# Patient Record
Sex: Female | Born: 1955 | Race: Black or African American | Hispanic: No | Marital: Single | Smoking: Never smoker
Health system: Southern US, Community
[De-identification: ages and names within clinical notes are randomized; demographics above are authoritative.]

## PROBLEM LIST (undated history)

## (undated) DIAGNOSIS — E785 Hyperlipidemia, unspecified: Secondary | ICD-10-CM

## (undated) DIAGNOSIS — I10 Essential (primary) hypertension: Secondary | ICD-10-CM

## (undated) DIAGNOSIS — I2510 Atherosclerotic heart disease of native coronary artery without angina pectoris: Secondary | ICD-10-CM

## (undated) HISTORY — DX: Essential (primary) hypertension: I10

## (undated) HISTORY — DX: Hyperlipidemia, unspecified: E78.5

## (undated) HISTORY — DX: Atherosclerotic heart disease of native coronary artery without angina pectoris: I25.10

## (undated) HISTORY — PX: BREAST BIOPSY: SHX20

---

## 1998-06-19 ENCOUNTER — Emergency Department (HOSPITAL_COMMUNITY): Admission: EM | Admit: 1998-06-19 | Discharge: 1998-06-19 | Payer: Self-pay | Admitting: Emergency Medicine

## 1999-12-19 ENCOUNTER — Other Ambulatory Visit: Admission: RE | Admit: 1999-12-19 | Discharge: 1999-12-19 | Payer: Self-pay | Admitting: Obstetrics and Gynecology

## 2000-06-16 ENCOUNTER — Other Ambulatory Visit: Admission: RE | Admit: 2000-06-16 | Discharge: 2000-06-16 | Payer: Self-pay | Admitting: Obstetrics and Gynecology

## 2000-09-01 ENCOUNTER — Other Ambulatory Visit: Admission: RE | Admit: 2000-09-01 | Discharge: 2000-09-01 | Payer: Self-pay | Admitting: Obstetrics and Gynecology

## 2001-03-09 ENCOUNTER — Other Ambulatory Visit: Admission: RE | Admit: 2001-03-09 | Discharge: 2001-03-09 | Payer: Self-pay | Admitting: Obstetrics and Gynecology

## 2001-05-03 ENCOUNTER — Encounter: Payer: Self-pay | Admitting: Family Medicine

## 2001-05-03 ENCOUNTER — Encounter: Admission: RE | Admit: 2001-05-03 | Discharge: 2001-05-03 | Payer: Self-pay | Admitting: Family Medicine

## 2002-02-21 ENCOUNTER — Other Ambulatory Visit: Admission: RE | Admit: 2002-02-21 | Discharge: 2002-02-21 | Payer: Self-pay | Admitting: Obstetrics and Gynecology

## 2003-04-09 ENCOUNTER — Other Ambulatory Visit: Admission: RE | Admit: 2003-04-09 | Discharge: 2003-04-09 | Payer: Self-pay | Admitting: Obstetrics and Gynecology

## 2004-05-30 ENCOUNTER — Other Ambulatory Visit: Admission: RE | Admit: 2004-05-30 | Discharge: 2004-05-30 | Payer: Self-pay | Admitting: Obstetrics and Gynecology

## 2015-04-15 ENCOUNTER — Other Ambulatory Visit: Payer: Self-pay | Admitting: Orthopedic Surgery

## 2015-04-15 DIAGNOSIS — M898X5 Other specified disorders of bone, thigh: Secondary | ICD-10-CM

## 2015-04-18 ENCOUNTER — Other Ambulatory Visit: Payer: Self-pay

## 2015-04-19 ENCOUNTER — Other Ambulatory Visit: Payer: Self-pay

## 2015-04-22 ENCOUNTER — Ambulatory Visit
Admission: RE | Admit: 2015-04-22 | Discharge: 2015-04-22 | Disposition: A | Discharge status: Home / Self Care | Payer: Commercial Managed Care - HMO | Source: Ambulatory Visit | Attending: Orthopedic Surgery | Admitting: Orthopedic Surgery

## 2015-04-22 DIAGNOSIS — M898X5 Other specified disorders of bone, thigh: Secondary | ICD-10-CM

## 2015-04-22 MED ORDER — IOPAMIDOL (ISOVUE-300) INJECTION 61%
100.0000 mL | Freq: Once | INTRAVENOUS | Status: AC | PRN
  Administered 2015-04-22: 100 mL via INTRAVENOUS

## 2016-10-29 DIAGNOSIS — Z86010 Personal history of colonic polyps: Secondary | ICD-10-CM | POA: Diagnosis not present

## 2016-10-29 DIAGNOSIS — K640 First degree hemorrhoids: Secondary | ICD-10-CM | POA: Diagnosis not present

## 2016-10-29 DIAGNOSIS — D126 Benign neoplasm of colon, unspecified: Secondary | ICD-10-CM | POA: Diagnosis not present

## 2017-01-11 DIAGNOSIS — Z1231 Encounter for screening mammogram for malignant neoplasm of breast: Secondary | ICD-10-CM | POA: Diagnosis not present

## 2017-01-11 DIAGNOSIS — E7800 Pure hypercholesterolemia, unspecified: Secondary | ICD-10-CM | POA: Diagnosis not present

## 2017-01-11 DIAGNOSIS — Z01419 Encounter for gynecological examination (general) (routine) without abnormal findings: Secondary | ICD-10-CM | POA: Diagnosis not present

## 2017-03-08 DIAGNOSIS — H00024 Hordeolum internum left upper eyelid: Secondary | ICD-10-CM | POA: Diagnosis not present

## 2017-03-12 DIAGNOSIS — Z1159 Encounter for screening for other viral diseases: Secondary | ICD-10-CM | POA: Diagnosis not present

## 2017-03-12 DIAGNOSIS — D649 Anemia, unspecified: Secondary | ICD-10-CM | POA: Diagnosis not present

## 2017-03-12 DIAGNOSIS — Z0000 Encounter for general adult medical examination without abnormal findings: Secondary | ICD-10-CM | POA: Diagnosis not present

## 2017-03-12 DIAGNOSIS — E7800 Pure hypercholesterolemia, unspecified: Secondary | ICD-10-CM | POA: Diagnosis not present

## 2018-01-24 DIAGNOSIS — Z1231 Encounter for screening mammogram for malignant neoplasm of breast: Secondary | ICD-10-CM | POA: Diagnosis not present

## 2018-01-24 DIAGNOSIS — Z01419 Encounter for gynecological examination (general) (routine) without abnormal findings: Secondary | ICD-10-CM | POA: Diagnosis not present

## 2018-01-24 DIAGNOSIS — Z124 Encounter for screening for malignant neoplasm of cervix: Secondary | ICD-10-CM | POA: Diagnosis not present

## 2018-02-09 DIAGNOSIS — H52223 Regular astigmatism, bilateral: Secondary | ICD-10-CM | POA: Diagnosis not present

## 2018-02-09 DIAGNOSIS — H35033 Hypertensive retinopathy, bilateral: Secondary | ICD-10-CM | POA: Diagnosis not present

## 2018-04-08 DIAGNOSIS — Z23 Encounter for immunization: Secondary | ICD-10-CM | POA: Diagnosis not present

## 2018-04-08 DIAGNOSIS — D649 Anemia, unspecified: Secondary | ICD-10-CM | POA: Diagnosis not present

## 2018-04-08 DIAGNOSIS — Z0000 Encounter for general adult medical examination without abnormal findings: Secondary | ICD-10-CM | POA: Diagnosis not present

## 2018-04-08 DIAGNOSIS — E7800 Pure hypercholesterolemia, unspecified: Secondary | ICD-10-CM | POA: Diagnosis not present

## 2018-06-08 DIAGNOSIS — M1712 Unilateral primary osteoarthritis, left knee: Secondary | ICD-10-CM | POA: Diagnosis not present

## 2018-06-08 DIAGNOSIS — M47816 Spondylosis without myelopathy or radiculopathy, lumbar region: Secondary | ICD-10-CM | POA: Diagnosis not present

## 2018-06-09 DIAGNOSIS — M545 Low back pain: Secondary | ICD-10-CM | POA: Diagnosis not present

## 2018-06-09 DIAGNOSIS — M6281 Muscle weakness (generalized): Secondary | ICD-10-CM | POA: Diagnosis not present

## 2018-06-09 DIAGNOSIS — M4316 Spondylolisthesis, lumbar region: Secondary | ICD-10-CM | POA: Diagnosis not present

## 2018-06-13 DIAGNOSIS — M4316 Spondylolisthesis, lumbar region: Secondary | ICD-10-CM | POA: Diagnosis not present

## 2018-06-13 DIAGNOSIS — M6281 Muscle weakness (generalized): Secondary | ICD-10-CM | POA: Diagnosis not present

## 2018-06-13 DIAGNOSIS — M545 Low back pain: Secondary | ICD-10-CM | POA: Diagnosis not present

## 2018-06-21 DIAGNOSIS — M4316 Spondylolisthesis, lumbar region: Secondary | ICD-10-CM | POA: Diagnosis not present

## 2018-06-21 DIAGNOSIS — M6281 Muscle weakness (generalized): Secondary | ICD-10-CM | POA: Diagnosis not present

## 2018-06-21 DIAGNOSIS — M545 Low back pain: Secondary | ICD-10-CM | POA: Diagnosis not present

## 2018-07-13 DIAGNOSIS — M4316 Spondylolisthesis, lumbar region: Secondary | ICD-10-CM | POA: Diagnosis not present

## 2018-07-13 DIAGNOSIS — M48061 Spinal stenosis, lumbar region without neurogenic claudication: Secondary | ICD-10-CM | POA: Diagnosis not present

## 2018-07-18 DIAGNOSIS — M545 Low back pain: Secondary | ICD-10-CM | POA: Diagnosis not present

## 2019-01-09 ENCOUNTER — Other Ambulatory Visit: Payer: Self-pay | Admitting: Obstetrics & Gynecology

## 2019-01-09 ENCOUNTER — Other Ambulatory Visit (HOSPITAL_COMMUNITY): Payer: Self-pay | Admitting: Obstetrics & Gynecology

## 2019-01-09 DIAGNOSIS — D259 Leiomyoma of uterus, unspecified: Secondary | ICD-10-CM

## 2019-01-13 ENCOUNTER — Other Ambulatory Visit: Payer: Self-pay

## 2019-01-13 ENCOUNTER — Ambulatory Visit (HOSPITAL_COMMUNITY)
Admission: RE | Admit: 2019-01-13 | Discharge: 2019-01-13 | Disposition: A | Discharge status: Home / Self Care | Payer: 59 | Source: Ambulatory Visit | Attending: Obstetrics & Gynecology | Admitting: Obstetrics & Gynecology

## 2019-01-13 DIAGNOSIS — D259 Leiomyoma of uterus, unspecified: Secondary | ICD-10-CM | POA: Diagnosis not present

## 2019-02-28 ENCOUNTER — Other Ambulatory Visit: Payer: Self-pay | Admitting: Obstetrics & Gynecology

## 2019-02-28 DIAGNOSIS — R928 Other abnormal and inconclusive findings on diagnostic imaging of breast: Secondary | ICD-10-CM

## 2019-03-01 ENCOUNTER — Ambulatory Visit
Admission: RE | Admit: 2019-03-01 | Discharge: 2019-03-01 | Disposition: A | Discharge status: Home / Self Care | Payer: BC Managed Care – PPO | Source: Ambulatory Visit | Attending: Obstetrics & Gynecology | Admitting: Obstetrics & Gynecology

## 2019-03-01 ENCOUNTER — Other Ambulatory Visit: Payer: Self-pay

## 2019-03-01 ENCOUNTER — Other Ambulatory Visit: Payer: Self-pay | Admitting: Obstetrics & Gynecology

## 2019-03-01 DIAGNOSIS — R921 Mammographic calcification found on diagnostic imaging of breast: Secondary | ICD-10-CM

## 2019-03-01 DIAGNOSIS — R928 Other abnormal and inconclusive findings on diagnostic imaging of breast: Secondary | ICD-10-CM

## 2019-03-07 ENCOUNTER — Ambulatory Visit
Admission: RE | Admit: 2019-03-07 | Discharge: 2019-03-07 | Disposition: A | Discharge status: Home / Self Care | Payer: BC Managed Care – PPO | Source: Ambulatory Visit | Attending: Obstetrics & Gynecology | Admitting: Obstetrics & Gynecology

## 2019-03-07 ENCOUNTER — Other Ambulatory Visit: Payer: Self-pay

## 2019-03-07 DIAGNOSIS — R921 Mammographic calcification found on diagnostic imaging of breast: Secondary | ICD-10-CM

## 2019-05-28 ENCOUNTER — Ambulatory Visit: Payer: BC Managed Care – PPO | Attending: Internal Medicine

## 2019-05-28 DIAGNOSIS — Z23 Encounter for immunization: Secondary | ICD-10-CM | POA: Insufficient documentation

## 2019-05-28 NOTE — Progress Notes (Signed)
   Covid-19 Vaccination Clinic  Name:  Sherry Webb    MRN: [id number] DOB: September 14, 1955  05/28/2019  [name] was observed post Covid-19 immunization for 15 minutes without incident. She was provided with Vaccine Information Sheet and instruction to access the V-Safe system.   [name] was instructed to call 911 with any severe reactions post vaccine: Marland Kitchen Difficulty breathing  . Swelling of face and throat  . A fast heartbeat  . A bad rash all over body  . Dizziness and weakness   Immunizations Administered    Name Date Dose VIS Date Route   Pfizer COVID-19 Vaccine 05/28/2019  2:34 PM 0.3 mL 03/03/2019 Intramuscular   Manufacturer: [place]   Lot: [id number]   [place]: [id number]

## 2019-06-28 ENCOUNTER — Ambulatory Visit: Payer: BC Managed Care – PPO | Attending: Internal Medicine

## 2019-06-28 ENCOUNTER — Ambulatory Visit: Payer: BC Managed Care – PPO

## 2019-06-28 DIAGNOSIS — Z23 Encounter for immunization: Secondary | ICD-10-CM

## 2019-10-11 ENCOUNTER — Other Ambulatory Visit: Payer: Self-pay

## 2019-10-11 ENCOUNTER — Ambulatory Visit (INDEPENDENT_AMBULATORY_CARE_PROVIDER_SITE_OTHER): Payer: 59 | Admitting: Podiatry

## 2019-10-11 ENCOUNTER — Encounter: Payer: Self-pay | Admitting: Podiatry

## 2019-10-11 DIAGNOSIS — B351 Tinea unguium: Secondary | ICD-10-CM | POA: Diagnosis not present

## 2019-10-11 DIAGNOSIS — L603 Nail dystrophy: Secondary | ICD-10-CM

## 2019-10-11 NOTE — Progress Notes (Signed)
  Subjective:  Patient ID: Sherry Webb, female    DOB: 05-09-1955,  MRN: [id number]  Chief Complaint  Patient presents with  . Nail Problem    Bilateral 2nd toenails. x3-4 months. Pt stated, "I think I have nail fungus, but no OTC treatments have helped. I want to prevent it from spreading to my other nails".    64 y.o. female presents with the above complaint. History confirmed with patient.  She has noticed thickening in the toenails and it is uncomfortable.  Objective:  Physical Exam: warm, good capillary refill, no trophic changes or ulcerative lesions, normal DP and PT pulses and normal sensory exam. Thickening with nail dystrophy and discoloration of the second digit toenail bilaterally  Assessment:   1. Nail fungus   2. Dystrophic nail      Plan:  Patient was evaluated and treated and all questions answered.   Onychomycosis -Educated on etiology of nail fungus. -Nail sample taken for microbiology and histology.  Unclear if this is true onychomycosis versus nail dystrophy -Once I have the results, I will call her.  If there is no fungus we will treat with terbinafine 90-day course; if there is no nail fungus I will prescribe custom compounded cream for nail thinning and nail dystrophy     Return for nail re-check.

## 2019-10-19 ENCOUNTER — Ambulatory Visit: Payer: Self-pay | Admitting: Podiatry

## 2019-11-15 ENCOUNTER — Telehealth: Payer: Self-pay | Admitting: Podiatry

## 2019-11-15 NOTE — Telephone Encounter (Signed)
Path results have not been sent to [place], I spoke with [facility] and they are faxing to [place] today.

## 2019-11-15 NOTE — Telephone Encounter (Signed)
Pt called because she never heard about her test results from back in July. She is on her way to work now, please call her first thing tomorrow a.m.

## 2019-11-15 NOTE — Telephone Encounter (Signed)
Path received, shows dense keratinization c/w microtrauma, no fungal elements noted, no melanin, PCR negative for saprophytes, molds, yeasts, pseudomonas

## 2019-11-16 ENCOUNTER — Other Ambulatory Visit: Payer: Self-pay

## 2019-11-16 ENCOUNTER — Other Ambulatory Visit: Payer: Self-pay | Admitting: Podiatry

## 2019-11-16 MED ORDER — NONFORMULARY OR COMPOUNDED ITEM: Status: DC

## 2019-11-16 NOTE — Telephone Encounter (Signed)
Will be faxed to them today. Thanks!

## 2019-11-16 NOTE — Telephone Encounter (Signed)
I called Ms Goga but couldn't get through, only got a VM directly. If she calls back, her pathology results showed nail changes or dystrophy without any fungal infection.   I'd recommend a nail thinning treatment such as a compounded cream from [facility] or [employer] solution (both would be prescribed by me but would likely have some out of pocket cost for, they usually are not covered by insurance totally).

## 2019-11-16 NOTE — Telephone Encounter (Signed)
Pt called back and was informed her results did not show fungus. Pt would like to have a prescription compound sent in to [facility].

## 2020-01-18 ENCOUNTER — Ambulatory Visit (INDEPENDENT_AMBULATORY_CARE_PROVIDER_SITE_OTHER): Payer: BLUE CROSS/BLUE SHIELD | Admitting: Podiatry

## 2020-01-18 ENCOUNTER — Other Ambulatory Visit: Payer: Self-pay

## 2020-01-18 DIAGNOSIS — M21612 Bunion of left foot: Secondary | ICD-10-CM

## 2020-01-18 DIAGNOSIS — M2012 Hallux valgus (acquired), left foot: Secondary | ICD-10-CM

## 2020-01-18 DIAGNOSIS — M2142 Flat foot [pes planus] (acquired), left foot: Secondary | ICD-10-CM

## 2020-01-18 DIAGNOSIS — B351 Tinea unguium: Secondary | ICD-10-CM

## 2020-01-18 DIAGNOSIS — M2141 Flat foot [pes planus] (acquired), right foot: Secondary | ICD-10-CM | POA: Diagnosis not present

## 2020-01-18 MED ORDER — CICLOPIROX 8 % EX SOLN: Freq: Every day | CUTANEOUS | 0 refills | Status: DC

## 2020-01-18 NOTE — Progress Notes (Signed)
  Subjective:  Patient ID: Sherry Webb, female    DOB: [birth year],  MRN: [id number]  Chief Complaint  Patient presents with  . Follow-up    pt states that fungus is still present and her nails are turning black    64 y.o. female returns with the above complaint. History confirmed with patient.  She did not get the urea gel from [facility] as it was too expensive.  Objective:  Physical Exam: warm, good capillary refill, no trophic changes or ulcerative lesions, normal DP and PT pulses and normal sensory exam. Thickening with nail dystrophy and discoloration of the second digit toenail bilaterally  Assessment:   1. Onychomycosis   2. Pes planus of both feet   3. Hallux valgus with bunions, left      Plan:  Patient was evaluated and treated and all questions answered.   Onychomycosis -Her lab results from [facility] pathology was not able to detect onychomycosis.  She would like to treat this empirically which I think is reasonable.  Prescription for Penlac was sent to her pharmacy, hopeful that the Penlac will help to change improve the texture of the nails.  -She also inquired about arch supports for her feet as she is very active.  I recommended that she try power step orthotics to support her flattened arch.  She will discuss with her insurance if they cover custom molded orthoses and will call for casting if they do  -She also inquired about the bunion on her left foot.  It is becoming painful in shoe gear.  I gave her a silicone offloading pad to try to wear in her shoes.  If this is still bothersome at next visit we will take x-rays and discuss surgical intervention     Return in about 3 months (around 04/19/2020).

## 2020-01-18 NOTE — Patient Instructions (Signed)
More silicone pads can be purchased from:  [email or web address]  

## 2020-03-27 ENCOUNTER — Telehealth: Payer: Self-pay

## 2020-03-27 NOTE — Telephone Encounter (Signed)
Pt called in regarding insoles she was given at her visit on 01/18/2020. She stated that she would like to return them and would like to speak with someone regarding the bill she received.

## 2020-04-18 ENCOUNTER — Ambulatory Visit: Payer: BLUE CROSS/BLUE SHIELD | Admitting: Podiatry

## 2020-09-22 DIAGNOSIS — Z0100 Encounter for examination of eyes and vision without abnormal findings: Secondary | ICD-10-CM | POA: Diagnosis not present

## 2020-11-08 DIAGNOSIS — Z23 Encounter for immunization: Secondary | ICD-10-CM | POA: Diagnosis not present

## 2020-11-08 DIAGNOSIS — I10 Essential (primary) hypertension: Secondary | ICD-10-CM | POA: Diagnosis not present

## 2020-11-08 DIAGNOSIS — E7800 Pure hypercholesterolemia, unspecified: Secondary | ICD-10-CM | POA: Diagnosis not present

## 2020-12-12 ENCOUNTER — Other Ambulatory Visit: Payer: Self-pay | Admitting: Family Medicine

## 2020-12-12 ENCOUNTER — Ambulatory Visit
Admission: RE | Admit: 2020-12-12 | Discharge: 2020-12-12 | Disposition: A | Discharge status: Home / Self Care | Payer: Medicare HMO | Source: Ambulatory Visit | Attending: Family Medicine | Admitting: Family Medicine

## 2020-12-12 DIAGNOSIS — R059 Cough, unspecified: Secondary | ICD-10-CM

## 2021-03-26 DIAGNOSIS — Z6826 Body mass index (BMI) 26.0-26.9, adult: Secondary | ICD-10-CM | POA: Diagnosis not present

## 2021-03-26 DIAGNOSIS — Z1231 Encounter for screening mammogram for malignant neoplasm of breast: Secondary | ICD-10-CM | POA: Diagnosis not present

## 2021-03-26 DIAGNOSIS — Z01419 Encounter for gynecological examination (general) (routine) without abnormal findings: Secondary | ICD-10-CM | POA: Diagnosis not present

## 2021-04-03 ENCOUNTER — Other Ambulatory Visit: Payer: Self-pay | Admitting: Obstetrics & Gynecology

## 2021-04-04 ENCOUNTER — Other Ambulatory Visit: Payer: Self-pay | Admitting: Obstetrics & Gynecology

## 2021-04-04 DIAGNOSIS — Z1231 Encounter for screening mammogram for malignant neoplasm of breast: Secondary | ICD-10-CM

## 2021-05-12 DIAGNOSIS — Z0000 Encounter for general adult medical examination without abnormal findings: Secondary | ICD-10-CM | POA: Diagnosis not present

## 2021-05-12 DIAGNOSIS — Z23 Encounter for immunization: Secondary | ICD-10-CM | POA: Diagnosis not present

## 2021-05-12 DIAGNOSIS — E7800 Pure hypercholesterolemia, unspecified: Secondary | ICD-10-CM | POA: Diagnosis not present

## 2021-05-12 DIAGNOSIS — E2839 Other primary ovarian failure: Secondary | ICD-10-CM | POA: Diagnosis not present

## 2021-05-12 DIAGNOSIS — I10 Essential (primary) hypertension: Secondary | ICD-10-CM | POA: Diagnosis not present

## 2021-05-16 IMAGING — MG MM BREAST BX W/ LOC DEV 1ST LESION IMAGE BX SPEC STEREO GUIDE*R*
8 of 15 series · 8 of 19 positions shown · non-contrast
Comparison: Previous exams.
COMPARISON: Previous exams.

Addendum:
CLINICAL DATA: Patient with indeterminate calcifications in the
RIGHT breast presents today for stereotactic biopsy using 3D
tomosynthesis guidance.

EXAM:
RIGHT BREAST STEREOTACTIC CORE NEEDLE BIOPSY

[R (1 of 8)]
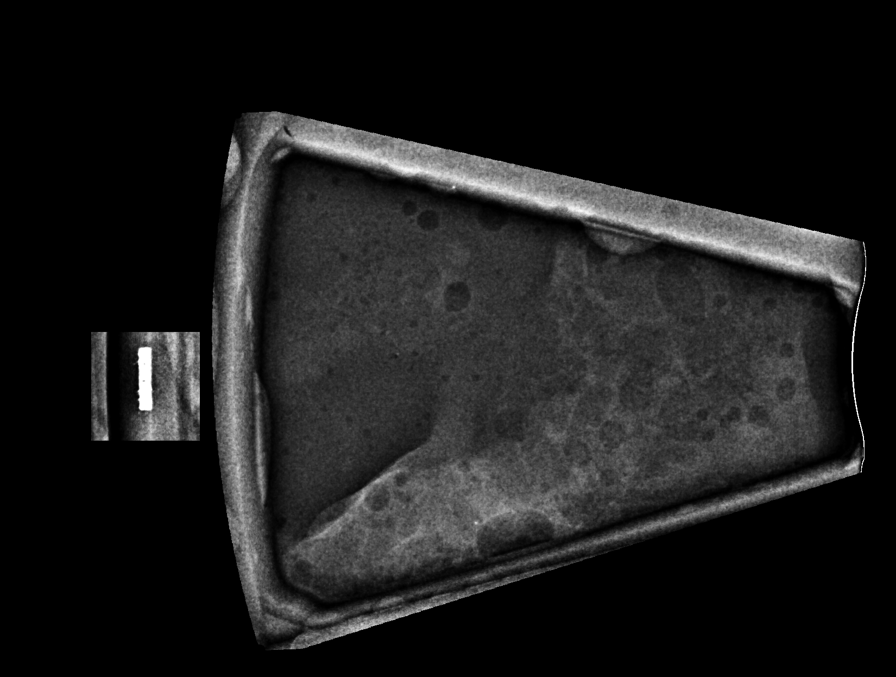

[R (2 of 8)]
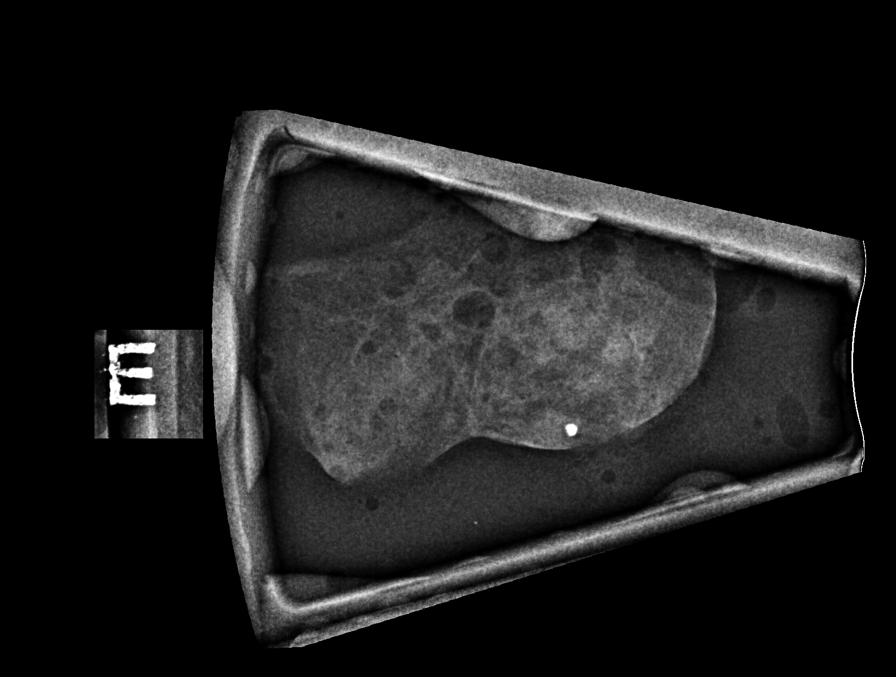

[R (3 of 8)]
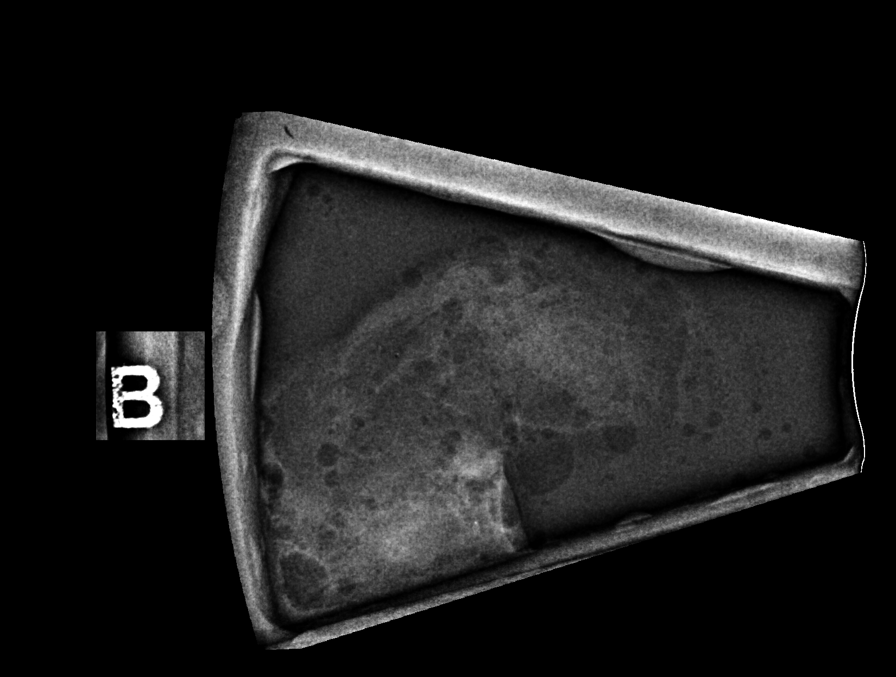

[R (4 of 8)]
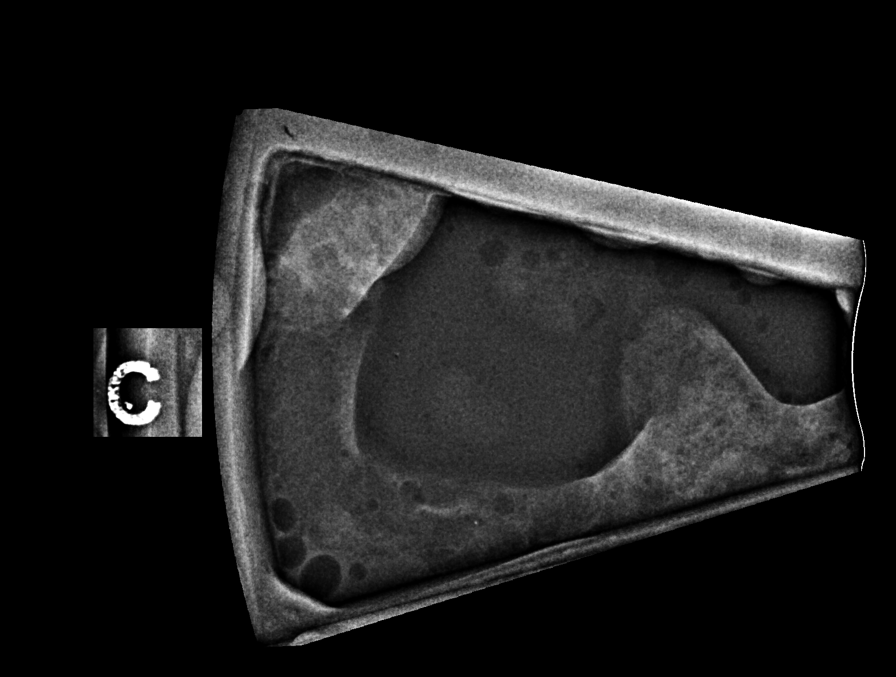

[R (5 of 8)]
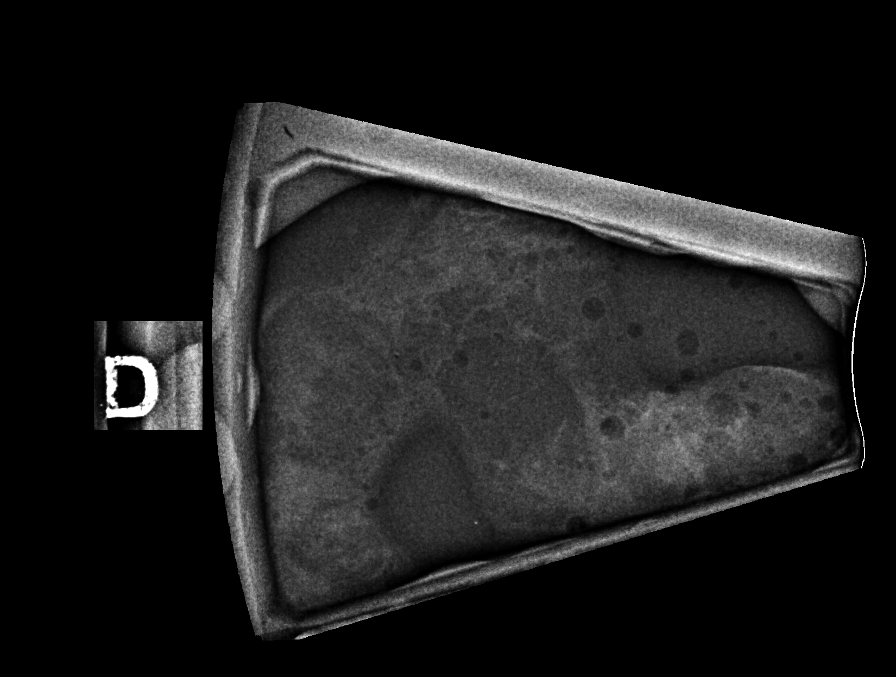

[R (6 of 8)]
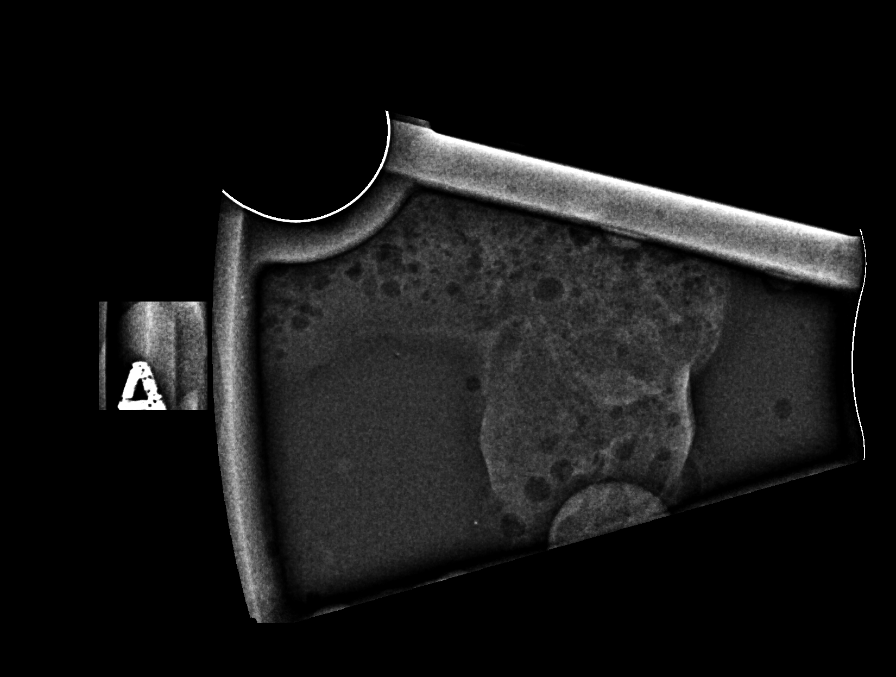

[R (7 of 8)]
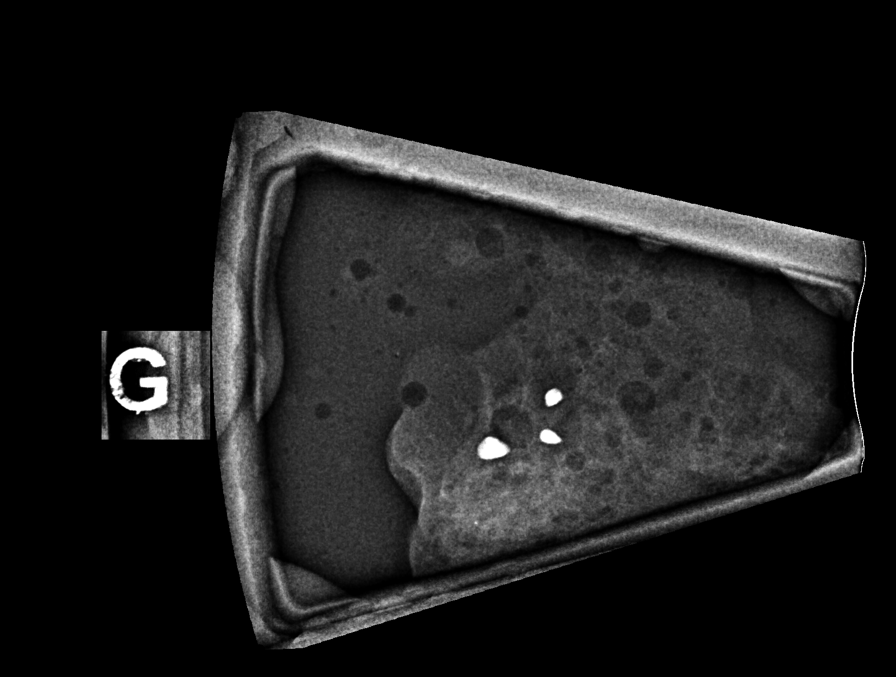

[R (8 of 8)]
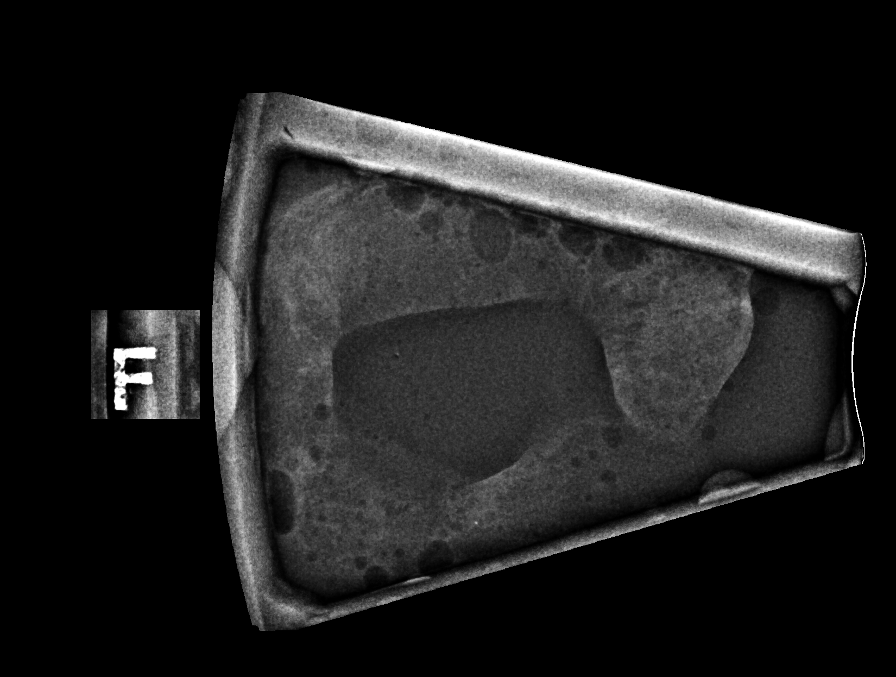

[8 of 19 positions shown; findings below may reference images not displayed]



Using sterile technique and 1% Lidocaine as local anesthetic, under
stereotactic guidance, a 9 gauge vacuum assisted device was used to
perform core needle biopsy of calcifications within the upper-outer
quadrant of the RIGHT breast using a lateral approach. Specimen
radiograph was performed showing calcifications. Specimens with
calcifications are identified for pathology.

Lesion quadrant: Upper outer quadrant

At the conclusion of the procedure, coil shaped tissue marker clip
was deployed into the biopsy cavity. Follow-up 2-view mammogram was
performed and dictated separately.
IMPRESSION: Stereotactic-guided biopsy of indeterminate calcifications within
the upper-outer quadrant of the RIGHT breast. No apparent
complications.

ADDENDUM:
Pathology revealed FIBROCYSTIC CHANGES WITH USUAL DUCTAL HYPERPLASIA
of the RIGHT breast, upper outer. This was found to be concordant by
Dr. Ambert Wung.

Pathology results were discussed with the patient by telephone. The
patient reported doing well after the biopsy with tenderness at the
site. Post biopsy instructions and care were reviewed and questions
were answered. The patient was encouraged to call The [REDACTED]

The patient was instructed to return for annual screening
mammography at [HOSPITAL] OBG[facility].

Pathology results reported by Klever Jumper, RN on 03/09/2019.



Using sterile technique and 1% Lidocaine as local anesthetic, under
stereotactic guidance, a 9 gauge vacuum assisted device was used to
perform core needle biopsy of calcifications within the upper-outer
quadrant of the RIGHT breast using a lateral approach. Specimen
radiograph was performed showing calcifications. Specimens with
calcifications are identified for pathology.

Lesion quadrant: Upper outer quadrant

At the conclusion of the procedure, coil shaped tissue marker clip
was deployed into the biopsy cavity. Follow-up 2-view mammogram was
performed and dictated separately.
IMPRESSION: Stereotactic-guided biopsy of indeterminate calcifications within
the upper-outer quadrant of the RIGHT breast. No apparent
complications.

## 2021-05-20 DIAGNOSIS — Z780 Asymptomatic menopausal state: Secondary | ICD-10-CM | POA: Diagnosis not present

## 2021-06-13 DIAGNOSIS — H903 Sensorineural hearing loss, bilateral: Secondary | ICD-10-CM | POA: Diagnosis not present

## 2021-08-11 ENCOUNTER — Ambulatory Visit (INDEPENDENT_AMBULATORY_CARE_PROVIDER_SITE_OTHER): Payer: Medicare HMO | Admitting: Podiatry

## 2021-08-11 DIAGNOSIS — M79675 Pain in left toe(s): Secondary | ICD-10-CM | POA: Diagnosis not present

## 2021-08-11 DIAGNOSIS — M79674 Pain in right toe(s): Secondary | ICD-10-CM | POA: Diagnosis not present

## 2021-08-11 DIAGNOSIS — B351 Tinea unguium: Secondary | ICD-10-CM

## 2021-08-12 ENCOUNTER — Encounter: Payer: Self-pay | Admitting: Podiatry

## 2021-08-12 NOTE — Progress Notes (Signed)
  Subjective:  Patient ID: Sherry Webb, female    DOB: 24-Jan-1956,  MRN: [id number]  Chief Complaint  Patient presents with   Nail Problem    nail fungus /nail trim    66 y.o. female returns with the above complaint. History confirmed with patient.  Nails have worsened and become thicker and brown discolored and have debris underneath the nail plate now.  She did not have much improvement with Penlac  Objective:  Physical Exam: warm, good capillary refill, no trophic changes or ulcerative lesions, normal DP and PT pulses and normal sensory exam.  She denies discoloration of all the nails with thickening and dystrophy, subungual debris the worst of which is still the second toenail bilateral  Assessment:   1. Pain due to onychomycosis of toenails of both feet      Plan:  Patient was evaluated and treated and all questions answered.   Discussed the etiology and treatment options for the condition in detail with the patient. Educated patient on the topical and oral treatment options for mycotic nails. Recommended debridement of the nails today. Sharp and mechanical debridement performed of all painful and mycotic nails today. Nails debrided in length and thickness using a nail nipper to level of comfort. Discussed treatment options including appropriate shoe gear. Follow up as needed for painful nails.       Return in about 6 months (around 02/11/2022) for painful fungal nails.

## 2021-11-10 DIAGNOSIS — I10 Essential (primary) hypertension: Secondary | ICD-10-CM | POA: Diagnosis not present

## 2021-11-10 DIAGNOSIS — E7800 Pure hypercholesterolemia, unspecified: Secondary | ICD-10-CM | POA: Diagnosis not present

## 2021-12-22 DIAGNOSIS — Z83719 Family history of colon polyps, unspecified: Secondary | ICD-10-CM | POA: Diagnosis not present

## 2021-12-22 DIAGNOSIS — K648 Other hemorrhoids: Secondary | ICD-10-CM | POA: Diagnosis not present

## 2021-12-22 DIAGNOSIS — Z09 Encounter for follow-up examination after completed treatment for conditions other than malignant neoplasm: Secondary | ICD-10-CM | POA: Diagnosis not present

## 2021-12-22 DIAGNOSIS — Z86010 Personal history of colonic polyps: Secondary | ICD-10-CM | POA: Diagnosis not present

## 2021-12-22 DIAGNOSIS — K5730 Diverticulosis of large intestine without perforation or abscess without bleeding: Secondary | ICD-10-CM | POA: Diagnosis not present

## 2022-01-12 DIAGNOSIS — Z0100 Encounter for examination of eyes and vision without abnormal findings: Secondary | ICD-10-CM | POA: Diagnosis not present

## 2022-04-13 DIAGNOSIS — Z1231 Encounter for screening mammogram for malignant neoplasm of breast: Secondary | ICD-10-CM | POA: Diagnosis not present

## 2022-04-13 DIAGNOSIS — Z6835 Body mass index (BMI) 35.0-35.9, adult: Secondary | ICD-10-CM | POA: Diagnosis not present

## 2022-04-13 DIAGNOSIS — Z01419 Encounter for gynecological examination (general) (routine) without abnormal findings: Secondary | ICD-10-CM | POA: Diagnosis not present

## 2022-05-18 DIAGNOSIS — E2839 Other primary ovarian failure: Secondary | ICD-10-CM | POA: Diagnosis not present

## 2022-05-18 DIAGNOSIS — R7301 Impaired fasting glucose: Secondary | ICD-10-CM | POA: Diagnosis not present

## 2022-05-18 DIAGNOSIS — Z136 Encounter for screening for cardiovascular disorders: Secondary | ICD-10-CM | POA: Diagnosis not present

## 2022-05-18 DIAGNOSIS — E7800 Pure hypercholesterolemia, unspecified: Secondary | ICD-10-CM | POA: Diagnosis not present

## 2022-05-18 DIAGNOSIS — N951 Menopausal and female climacteric states: Secondary | ICD-10-CM | POA: Diagnosis not present

## 2022-05-18 DIAGNOSIS — I10 Essential (primary) hypertension: Secondary | ICD-10-CM | POA: Diagnosis not present

## 2022-05-18 DIAGNOSIS — Z0000 Encounter for general adult medical examination without abnormal findings: Secondary | ICD-10-CM | POA: Diagnosis not present

## 2022-05-28 DIAGNOSIS — H109 Unspecified conjunctivitis: Secondary | ICD-10-CM | POA: Diagnosis not present

## 2022-05-28 DIAGNOSIS — J209 Acute bronchitis, unspecified: Secondary | ICD-10-CM | POA: Diagnosis not present

## 2022-06-12 ENCOUNTER — Ambulatory Visit: Payer: Medicare HMO | Admitting: Cardiology

## 2022-06-12 ENCOUNTER — Encounter: Payer: Self-pay | Admitting: Cardiology

## 2022-06-12 VITALS — BP 114/93 | HR 89 | Resp 16 | Ht 61.0 in | Wt 140.0 lb

## 2022-06-12 DIAGNOSIS — I10 Essential (primary) hypertension: Secondary | ICD-10-CM

## 2022-06-12 DIAGNOSIS — R9431 Abnormal electrocardiogram [ECG] [EKG]: Secondary | ICD-10-CM

## 2022-06-12 DIAGNOSIS — R931 Abnormal findings on diagnostic imaging of heart and coronary circulation: Secondary | ICD-10-CM

## 2022-06-12 DIAGNOSIS — E782 Mixed hyperlipidemia: Secondary | ICD-10-CM | POA: Diagnosis not present

## 2022-06-12 DIAGNOSIS — I2584 Coronary atherosclerosis due to calcified coronary lesion: Secondary | ICD-10-CM | POA: Diagnosis not present

## 2022-06-12 DIAGNOSIS — I2510 Atherosclerotic heart disease of native coronary artery without angina pectoris: Secondary | ICD-10-CM | POA: Diagnosis not present

## 2022-06-12 MED ORDER — ASPIRIN 81 MG PO TBEC: 81.0000 mg | DELAYED_RELEASE_TABLET | Freq: Every day | ORAL | 12 refills | Status: AC

## 2022-06-12 MED ORDER — ATORVASTATIN CALCIUM 40 MG PO TABS: 40.0000 mg | ORAL_TABLET | Freq: Every day | ORAL | 0 refills | Status: DC

## 2022-06-12 NOTE — Progress Notes (Signed)
ID:  Sherry Webb, DOB Aug 20, 1955, MRN [id number]  PCP:  Sherry Jewel, MD  Cardiologist:  Sherry Kras, DO, [facility] (established care 06/12/2022)  REASON FOR CONSULT: Coronary artery calcification  REQUESTING PHYSICIAN:  Pahwani, Michell Heinrich, MD [address] [place] [id number]   Chief Complaint  Patient presents with   Coronary Artery Disease   New Patient (Initial Visit)    HPI  Sherry Webb is a 67 y.o. African-American female who presents to the clinic for evaluation of coronary artery disease at the request of Pahwani, Rinka R, MD. Her past medical history and cardiovascular risk factors include: Moderate coronary artery calcification, hypertension, hyperlipidemia.  Patient states that she requested a coronary calcium score be performed to evaluate/risk stratify for coronary disease given her age and risk factors.  She underwent a CAC study at [facility] on 05/28/2022 and was noted to have a total CAC of 110 placing her at the 50th-75th percentile.  She is referred to cardiology for further evaluation and management.  Patient denies anginal chest pain or heart failure symptoms.   FUNCTIONAL STATUS: Lives independently but no structured exercise program or daily routine.  But does keep herself active.  ALLERGIES: Allergies  Allergen Reactions   Lisinopril Cough    MEDICATION LIST PRIOR TO VISIT: Current Meds  Medication Sig   aspirin EC 81 MG tablet Take 1 tablet (81 mg total) by mouth daily. Swallow whole.   atorvastatin (LIPITOR) 40 MG tablet Take 1 tablet (40 mg total) by mouth at bedtime.   hydrochlorothiazide (HYDRODIURIL) 25 MG tablet Take 25 mg by mouth daily.   Multiple Vitamin (MULTIVITAMIN) tablet Take 1 tablet by mouth daily.   [DISCONTINUED] pravastatin (PRAVACHOL) 40 MG tablet Take 40 mg by mouth daily.     PAST MEDICAL HISTORY: Past Medical History:  Diagnosis Date   Coronary artery calcification    Hyperlipidemia     Hypertension     PAST SURGICAL HISTORY: Past Surgical History:  Procedure Laterality Date   BREAST BIOPSY Right     FAMILY HISTORY: The patient family history includes Alcohol abuse in her father and mother.  SOCIAL HISTORY:  The patient  reports that she has never smoked. She has never used smokeless tobacco. She reports that she does not currently use alcohol. She reports that she does not use drugs.  REVIEW OF SYSTEMS: Review of Systems  Cardiovascular:  Negative for chest pain, claudication, dyspnea on exertion, irregular heartbeat, leg swelling, near-syncope, orthopnea, palpitations, paroxysmal nocturnal dyspnea and syncope.  Respiratory:  Negative for shortness of breath.   Hematologic/Lymphatic: Negative for bleeding problem.  Musculoskeletal:  Negative for muscle cramps and myalgias.  Neurological:  Negative for dizziness and light-headedness.    PHYSICAL EXAM:    06/12/2022   10:43 AM 06/12/2022   10:42 AM  Vitals with BMI  Height  5\' 1"   Weight  140 lbs  BMI  123456  Systolic [id number]  Diastolic 93 96  Pulse 89 87    Physical Exam  Constitutional: No distress.  Age appropriate, hemodynamically stable.   Neck: No JVD present.  Cardiovascular: Normal rate, regular rhythm, S1 normal, S2 normal, intact distal pulses and normal pulses. Exam reveals no gallop, no S3 and no S4.  No murmur heard. Pulmonary/Chest: Effort normal and breath sounds normal. No stridor. She has no wheezes. She has no rales.  Abdominal: Soft. Bowel sounds are normal. She exhibits no distension. There is no abdominal tenderness.  Musculoskeletal:  General: No edema.     Cervical back: Neck supple.  Neurological: She is alert and oriented to person, place, and time. She has intact cranial nerves (2-12).  Skin: Skin is warm and moist.   CARDIAC DATABASE: EKG: 06/12/2022: Sinus Rhythm, 91bpm, LAE,  consider anteroseptal infarct, without underlying injury  pattern.  Echocardiogram: No results found for this or any previous visit from the past 1095 days.    Stress Testing: No results found for this or any previous visit from the past 1095 days.   Heart Catheterization: None  Coronary calcium score Date of service 05/28/2022 at [place]. Left main 21. LAD 4. LCx 0. RCA 85. Total CAC 110, 50-75th percentile  LABORATORY DATA: External Labs: Collected: 05/18/2022 provided by PCP. BUN 19, creatinine 0.86. Sodium 138, potassium 3.5, chloride 101, bicarb 34. AST 24, ALT 16, alkaline phosphatase 51 A1c 6.0. Total cholesterol 210, triglycerides 78, HDL 61, LDL calculated 135, non-HDL 149   IMPRESSION:    ICD-10-CM   1. Coronary atherosclerosis due to calcified coronary lesion  I25.10 EKG 12-Lead   I25.84 atorvastatin (LIPITOR) 40 MG tablet    aspirin EC 81 MG tablet    CMP14+EGFR    Lipid Panel With LDL/HDL Ratio    LDL cholesterol, direct    PCV ECHOCARDIOGRAM COMPLETE    PCV MYOCARDIAL PERFUSION WO LEXISCAN    2. Agatston coronary artery calcium score between 100 and 400  R93.1 atorvastatin (LIPITOR) 40 MG tablet    aspirin EC 81 MG tablet    CMP14+EGFR    Lipid Panel With LDL/HDL Ratio    LDL cholesterol, direct    PCV ECHOCARDIOGRAM COMPLETE    PCV MYOCARDIAL PERFUSION WO LEXISCAN    3. Abnormal EKG  R94.31     4. Benign hypertension  I10     5. Mixed hyperlipidemia  E78.2 atorvastatin (LIPITOR) 40 MG tablet    CMP14+EGFR    Lipid Panel With LDL/HDL Ratio    LDL cholesterol, direct       RECOMMENDATIONS: Sherry Webb is a 67 y.o. African-American female whose past medical history and cardiac risk factors include: Moderate coronary artery calcification, hypertension, hyperlipidemia.  Coronary atherosclerosis due to calcified coronary lesion Agatston coronary artery calcium score between 100 and 400 Abnormal EKG Total CAC 110, 50th-75th percentile Denies anginal chest pain or heart failure  symptoms. EKG sinus rhythm with consideration of old anteroseptal infarct. Given the CAC, abnormal EKG, risk factors, recommend exercise nuclear stress test to evaluate for functional capacity and exercise-induced ischemia. Echo will be ordered to evaluate for structural heart disease and left ventricular systolic function. Start aspirin 81 mg p.o. daily Discontinue pravastatin Start atorvastatin 40 mg p.o. nightly Labs as 6 weeks to reevaluate lipids Follow-up in 7 weeks. If the follow-up labs illustrate her LDL is at goal we will reduce the dose of atorvastatin to 20 mg p.o. nightly.  And monitor longitudinally.  Benign hypertension Office blood pressures within acceptable range, but not at goal Her DBP has been elevated Compared to past readings. I have asked her to keep a log of her blood pressures and to review with PCP for further up titration of medical therapy if needed Reemphasized importance of low-salt diet  Mixed hyperlipidemia Changes as discussed above.  Data Reviewed: I have independently reviewed external notes provided by the referring provider as part of this office visit.   I have independently reviewed results of EKG, coronary calcium report, external Labs: Collected:  As part of medical  decision making. I have ordered the following tests:  Orders Placed This Encounter  Procedures   CMP14+EGFR   Lipid Panel With LDL/HDL Ratio   LDL cholesterol, direct   PCV MYOCARDIAL PERFUSION WO LEXISCAN    Standing Status:   Future    Standing Expiration Date:   06/12/2023   EKG 12-Lead   PCV ECHOCARDIOGRAM COMPLETE    Standing Status:   Future    Standing Expiration Date:   06/12/2023  I have made medications changes at today's encounter as noted above.  FINAL MEDICATION LIST END OF ENCOUNTER: Meds ordered this encounter  Medications   atorvastatin (LIPITOR) 40 MG tablet    Sig: Take 1 tablet (40 mg total) by mouth at bedtime.    Dispense:  90 tablet    Refill:  0    aspirin EC 81 MG tablet    Sig: Take 1 tablet (81 mg total) by mouth daily. Swallow whole.    Dispense:  30 tablet    Refill:  12    Medications Discontinued During This Encounter  Medication Reason   ciclopirox (PENLAC) 8 % solution    NONFORMULARY OR COMPOUNDED ITEM    pravastatin (PRAVACHOL) 40 MG tablet Change in therapy     Current Outpatient Medications:    aspirin EC 81 MG tablet, Take 1 tablet (81 mg total) by mouth daily. Swallow whole., Disp: 30 tablet, Rfl: 12   atorvastatin (LIPITOR) 40 MG tablet, Take 1 tablet (40 mg total) by mouth at bedtime., Disp: 90 tablet, Rfl: 0   hydrochlorothiazide (HYDRODIURIL) 25 MG tablet, Take 25 mg by mouth daily., Disp: , Rfl:    Multiple Vitamin (MULTIVITAMIN) tablet, Take 1 tablet by mouth daily., Disp: , Rfl:    potassium chloride (KLOR-CON M) 10 MEQ tablet, Take 10 mEq by mouth once., Disp: , Rfl:   Orders Placed This Encounter  Procedures   CMP14+EGFR   Lipid Panel With LDL/HDL Ratio   LDL cholesterol, direct   PCV MYOCARDIAL PERFUSION WO LEXISCAN   EKG 12-Lead   PCV ECHOCARDIOGRAM COMPLETE    There are no Patient Instructions on file for this visit.   --Continue cardiac medications as reconciled in final medication list. --Return in about 7 weeks (around 07/31/2022) for Follow up, Coronary artery calcification, Review test results. or sooner if needed. --Continue follow-up with your primary care physician regarding the management of your other chronic comorbid conditions.  Patient's questions and concerns were addressed to her satisfaction. She voices understanding of the instructions provided during this encounter.   This note was created using a voice recognition software as a result there may be grammatical errors inadvertently enclosed that do not reflect the nature of this encounter. Every attempt is made to correct such errors.  Sherry Webb, [place], [facility]  Pager:  [phone] Office: [phone]

## 2022-06-23 ENCOUNTER — Ambulatory Visit: Payer: Medicare HMO

## 2022-06-23 DIAGNOSIS — I2510 Atherosclerotic heart disease of native coronary artery without angina pectoris: Secondary | ICD-10-CM | POA: Diagnosis not present

## 2022-06-23 DIAGNOSIS — R931 Abnormal findings on diagnostic imaging of heart and coronary circulation: Secondary | ICD-10-CM

## 2022-06-23 DIAGNOSIS — I2584 Coronary atherosclerosis due to calcified coronary lesion: Secondary | ICD-10-CM | POA: Diagnosis not present

## 2022-06-24 NOTE — Progress Notes (Signed)
Gave patient results, she acknowledged information and had no further questions.

## 2022-07-09 ENCOUNTER — Ambulatory Visit: Payer: Medicare HMO

## 2022-07-09 DIAGNOSIS — R931 Abnormal findings on diagnostic imaging of heart and coronary circulation: Secondary | ICD-10-CM | POA: Diagnosis not present

## 2022-07-09 DIAGNOSIS — I2510 Atherosclerotic heart disease of native coronary artery without angina pectoris: Secondary | ICD-10-CM

## 2022-07-09 DIAGNOSIS — I2584 Coronary atherosclerosis due to calcified coronary lesion: Secondary | ICD-10-CM | POA: Diagnosis not present

## 2022-07-15 ENCOUNTER — Other Ambulatory Visit: Payer: Self-pay

## 2022-07-15 DIAGNOSIS — E782 Mixed hyperlipidemia: Secondary | ICD-10-CM

## 2022-07-15 DIAGNOSIS — R931 Abnormal findings on diagnostic imaging of heart and coronary circulation: Secondary | ICD-10-CM

## 2022-07-15 DIAGNOSIS — I2510 Atherosclerotic heart disease of native coronary artery without angina pectoris: Secondary | ICD-10-CM

## 2022-07-15 MED ORDER — ATORVASTATIN CALCIUM 40 MG PO TABS: 40.0000 mg | ORAL_TABLET | Freq: Every day | ORAL | 3 refills | Status: DC

## 2022-07-21 ENCOUNTER — Other Ambulatory Visit: Payer: Self-pay | Admitting: Physician Assistant

## 2022-07-21 ENCOUNTER — Ambulatory Visit
Admission: RE | Admit: 2022-07-21 | Discharge: 2022-07-21 | Disposition: A | Discharge status: Home / Self Care | Payer: Medicare HMO | Source: Ambulatory Visit | Attending: Physician Assistant | Admitting: Physician Assistant

## 2022-07-21 DIAGNOSIS — J069 Acute upper respiratory infection, unspecified: Secondary | ICD-10-CM | POA: Diagnosis not present

## 2022-07-21 DIAGNOSIS — R059 Cough, unspecified: Secondary | ICD-10-CM

## 2022-07-21 DIAGNOSIS — I10 Essential (primary) hypertension: Secondary | ICD-10-CM | POA: Diagnosis not present

## 2022-07-21 DIAGNOSIS — M791 Myalgia, unspecified site: Secondary | ICD-10-CM | POA: Diagnosis not present

## 2022-07-21 DIAGNOSIS — M79652 Pain in left thigh: Secondary | ICD-10-CM

## 2022-07-21 DIAGNOSIS — M6259 Muscle wasting and atrophy, not elsewhere classified, multiple sites: Secondary | ICD-10-CM | POA: Diagnosis not present

## 2022-07-21 DIAGNOSIS — E7800 Pure hypercholesterolemia, unspecified: Secondary | ICD-10-CM | POA: Diagnosis not present

## 2022-08-03 ENCOUNTER — Ambulatory Visit: Payer: Medicare HMO | Admitting: Cardiology

## 2022-08-06 ENCOUNTER — Encounter: Payer: Self-pay | Admitting: Cardiology

## 2022-08-06 ENCOUNTER — Ambulatory Visit: Payer: Medicare HMO | Admitting: Cardiology

## 2022-08-06 VITALS — BP 129/89 | HR 82 | Ht 61.0 in | Wt 142.6 lb

## 2022-08-06 DIAGNOSIS — R931 Abnormal findings on diagnostic imaging of heart and coronary circulation: Secondary | ICD-10-CM | POA: Diagnosis not present

## 2022-08-06 DIAGNOSIS — I2510 Atherosclerotic heart disease of native coronary artery without angina pectoris: Secondary | ICD-10-CM | POA: Diagnosis not present

## 2022-08-06 DIAGNOSIS — I10 Essential (primary) hypertension: Secondary | ICD-10-CM

## 2022-08-06 DIAGNOSIS — E782 Mixed hyperlipidemia: Secondary | ICD-10-CM | POA: Diagnosis not present

## 2022-08-06 DIAGNOSIS — I2584 Coronary atherosclerosis due to calcified coronary lesion: Secondary | ICD-10-CM | POA: Diagnosis not present

## 2022-08-06 MED ORDER — ATORVASTATIN CALCIUM 20 MG PO TABS: 20.0000 mg | ORAL_TABLET | Freq: Every day | ORAL | 3 refills | Status: DC

## 2022-08-06 NOTE — Progress Notes (Signed)
ID:  Sherry Webb, DOB 07/03/1955, MRN [id number]  PCP:  Ollen Bowl, MD  Cardiologist:  Tessa Lerner, DO, [facility] (established care 06/12/2022)  Date: 08/06/22 Last Office Visit: 06/12/2022   Chief Complaint  Patient presents with   Coronary atherosclerosis due to calcified coronary lesion   Follow-up    Test results    HPI  Sherry Webb is a 67 y.o. African-American female whose past medical history and cardiovascular risk factors include: Moderate coronary artery calcification, hypertension, hyperlipidemia.  Patient was referred to the practice given her coronary artery calcification.  She was started on aspirin, and pravastatin was changed to atorvastatin.  She was also recommended to undergo echo and stress test (results reviewed with her in detail and noted below for further reference:  Lipid profile still pending.  Clinically, denies anginal chest pain or heart failure symptoms.  But she is complaining of left-sided iliotibial band discomfort and questions if it is statin induced.  She describes the discomfort which appears to be more like arthritic but given the recent increase in statin therapy myalgias cannot be entirely ruled out.   FUNCTIONAL STATUS: Lives independently but no structured exercise program or daily routine.  But does keep herself active.  ALLERGIES: Allergies  Allergen Reactions   Lisinopril Cough    MEDICATION LIST PRIOR TO VISIT: Current Meds  Medication Sig   aspirin EC 81 MG tablet Take 1 tablet (81 mg total) by mouth daily. Swallow whole.   atorvastatin (LIPITOR) 20 MG tablet Take 1 tablet (20 mg total) by mouth at bedtime.   hydrochlorothiazide (HYDRODIURIL) 25 MG tablet Take 25 mg by mouth daily.   Multiple Vitamin (MULTIVITAMIN) tablet Take 1 tablet by mouth daily.   potassium chloride (KLOR-CON M) 10 MEQ tablet Take 10 mEq by mouth once.   [DISCONTINUED] atorvastatin (LIPITOR) 40 MG tablet Take 1 tablet (40 mg  total) by mouth at bedtime.     PAST MEDICAL HISTORY: Past Medical History:  Diagnosis Date   Coronary artery calcification    Hyperlipidemia    Hypertension     PAST SURGICAL HISTORY: Past Surgical History:  Procedure Laterality Date   BREAST BIOPSY Right     FAMILY HISTORY: The patient family history includes Alcohol abuse in her father and mother.  SOCIAL HISTORY:  The patient  reports that she has never smoked. She has never used smokeless tobacco. She reports that she does not currently use alcohol. She reports that she does not use drugs.  REVIEW OF SYSTEMS: Review of Systems  Cardiovascular:  Negative for chest pain, claudication, dyspnea on exertion, irregular heartbeat, leg swelling, near-syncope, orthopnea, palpitations, paroxysmal nocturnal dyspnea and syncope.  Respiratory:  Negative for shortness of breath.   Hematologic/Lymphatic: Negative for bleeding problem.  Musculoskeletal:  Positive for arthritis and myalgias (left IT band). Negative for muscle cramps.  Neurological:  Negative for dizziness and light-headedness.    PHYSICAL EXAM:    08/06/2022    9:30 AM 06/12/2022   10:43 AM 06/12/2022   10:42 AM  Vitals with BMI  Height 5\' 1"   5\' 1"   Weight 142 lbs 10 oz  140 lbs  BMI 26.96  26.47  Systolic 129 114 409  Diastolic 89 93 96  Pulse 82 89 87    Physical Exam  Constitutional: No distress.  Age appropriate, hemodynamically stable.   Neck: No JVD present.  Cardiovascular: Normal rate, regular rhythm, S1 normal, S2 normal, intact distal pulses and normal pulses. Exam reveals no  gallop, no S3 and no S4.  No murmur heard. Pulmonary/Chest: Effort normal and breath sounds normal. No stridor. She has no wheezes. She has no rales.  Abdominal: Soft. Bowel sounds are normal. She exhibits no distension. There is no abdominal tenderness.  Musculoskeletal:        General: No edema.     Cervical back: Neck supple.  Neurological: She is alert and oriented to  person, place, and time. She has intact cranial nerves (2-12).  Skin: Skin is warm and moist.   CARDIAC DATABASE: EKG: 06/12/2022: Sinus Rhythm, 91bpm, LAE,  consider anteroseptal infarct, without underlying injury pattern.  Echocardiogram: 07/09/2022:  Normal LV systolic function with visual EF 60-65%. Left ventricle cavity is normal in size. Mild concentric hypertrophy of the left ventricle. Normal global wall motion. Doppler evidence of grade I (impaired) diastolic dysfunction, normal LAP. Calculated EF 62%. Trileaflet aortic valve. Mild (Grade I) aortic regurgitation. Mild aortic valve leaflet calcification. Structurally normal mitral valve. Mild (Grade I) mitral regurgitation. Structurally normal tricuspid valve. Mild tricuspid regurgitation. No evidence of pulmonary hypertension. No prior available for comparison.    Stress Testing: Regadenoson (with Mod Bruce protocol) Nuclear stress test 06/23/2022 Myocardial perfusion is normal. Overall LV systolic function is normal without regional wall motion abnormalities. Stress LV EF: 59%. Low risk study. Nondiagnostic ECG stress. The heart rate response was consistent with Regadenoson. The blood pressure response was physiologic. No previous exam available for comparison.    Heart Catheterization: None  Coronary calcium score Date of service 05/28/2022 at [facility]. Left main 21. LAD 4. LCx 0. RCA 85. Total CAC 110, 50-75th percentile  LABORATORY DATA: External Labs: Collected: 05/18/2022 provided by PCP. BUN 19, creatinine 0.86. Sodium 138, potassium 3.5, chloride 101, bicarb 34. AST 24, ALT 16, alkaline phosphatase 51 A1c 6.0. Total cholesterol 210, triglycerides 78, HDL 61, LDL calculated 135, non-HDL 149   IMPRESSION:    ICD-10-CM   1. Coronary atherosclerosis due to calcified coronary lesion  I25.10 atorvastatin (LIPITOR) 20 MG tablet   I25.84     2. Agatston coronary artery calcium score between 100 and 400   R93.1 atorvastatin (LIPITOR) 20 MG tablet    3. Mixed hyperlipidemia  E78.2 atorvastatin (LIPITOR) 20 MG tablet    4. Benign hypertension  I10        RECOMMENDATIONS: Sherry Webb is a 67 y.o. African-American female whose past medical history and cardiac risk factors include: Moderate coronary artery calcification, hypertension, hyperlipidemia.  Coronary atherosclerosis due to calcified coronary lesion Agatston coronary artery calcium score between 100 and 400 Abnormal EKG Total CAC 110, 50th-75th percentile Denies anginal chest pain or heart failure symptoms. EKG sinus rhythm with consideration of old anteroseptal infarct. MPI: Low risk study Echo: Preserved LVEF, grade 1 diastolic dysfunction, mild valvular heart disease. Continue aspirin and statin therapy  Follow-up lipids after up titration of statin therapy were not performed. Given her discomfort over the left iliotibial band the shared decision was to reduce atorvastatin to 20 mg p.o. nightly in 6 weeks she will have repeat lipids to evaluate therapy  Benign hypertension Office blood pressures are well-controlled. No changes warranted.  Mixed hyperlipidemia Currently on atorvastatin.   Complains of left iliotibial discomfort likely arthritic but a component of discomfort may be myalgia also.  Will reduce the dose as discussed above When she is on atorvastatin 20 mg p.o. nightly for 6 weeks I have asked her to get fasting lipid profile to reevaluate her LDL levels  FINAL MEDICATION  LIST END OF ENCOUNTER: Meds ordered this encounter  Medications   atorvastatin (LIPITOR) 20 MG tablet    Sig: Take 1 tablet (20 mg total) by mouth at bedtime.    Dispense:  90 tablet    Refill:  3    Medications Discontinued During This Encounter  Medication Reason   atorvastatin (LIPITOR) 40 MG tablet Dose change     Current Outpatient Medications:    aspirin EC 81 MG tablet, Take 1 tablet (81 mg total) by mouth daily.  Swallow whole., Disp: 30 tablet, Rfl: 12   atorvastatin (LIPITOR) 20 MG tablet, Take 1 tablet (20 mg total) by mouth at bedtime., Disp: 90 tablet, Rfl: 3   hydrochlorothiazide (HYDRODIURIL) 25 MG tablet, Take 25 mg by mouth daily., Disp: , Rfl:    Multiple Vitamin (MULTIVITAMIN) tablet, Take 1 tablet by mouth daily., Disp: , Rfl:    potassium chloride (KLOR-CON M) 10 MEQ tablet, Take 10 mEq by mouth once., Disp: , Rfl:   No orders of the defined types were placed in this encounter.   There are no Patient Instructions on file for this visit.   --Continue cardiac medications as reconciled in final medication list. --Return in about 1 year (around 08/06/2023) for Annual follow up visit, Coronary artery calcification. or sooner if needed. --Continue follow-up with your primary care physician regarding the management of your other chronic comorbid conditions.  Patient's questions and concerns were addressed to her satisfaction. She voices understanding of the instructions provided during this encounter.   This note was created using a voice recognition software as a result there may be grammatical errors inadvertently enclosed that do not reflect the nature of this encounter. Every attempt is made to correct such errors.  Tessa Lerner, [place], [facility]  Pager:  [phone] Office: [phone]

## 2022-08-10 DIAGNOSIS — M79605 Pain in left leg: Secondary | ICD-10-CM | POA: Diagnosis not present

## 2022-08-10 DIAGNOSIS — M1990 Unspecified osteoarthritis, unspecified site: Secondary | ICD-10-CM | POA: Diagnosis not present

## 2022-08-24 DIAGNOSIS — I2510 Atherosclerotic heart disease of native coronary artery without angina pectoris: Secondary | ICD-10-CM | POA: Diagnosis not present

## 2022-08-24 DIAGNOSIS — M79605 Pain in left leg: Secondary | ICD-10-CM | POA: Diagnosis not present

## 2022-08-24 DIAGNOSIS — M1990 Unspecified osteoarthritis, unspecified site: Secondary | ICD-10-CM | POA: Diagnosis not present

## 2022-08-24 DIAGNOSIS — E782 Mixed hyperlipidemia: Secondary | ICD-10-CM | POA: Diagnosis not present

## 2022-08-24 DIAGNOSIS — I2584 Coronary atherosclerosis due to calcified coronary lesion: Secondary | ICD-10-CM | POA: Diagnosis not present

## 2022-08-24 DIAGNOSIS — R931 Abnormal findings on diagnostic imaging of heart and coronary circulation: Secondary | ICD-10-CM | POA: Diagnosis not present

## 2022-08-25 LAB — CMP14+EGFR
ALT: 24 IU/L (ref 0–32)
AST: 24 IU/L (ref 0–40)
Albumin/Globulin Ratio: 1.7 (ref 1.2–2.2)
Albumin: 4.2 g/dL (ref 3.9–4.9)
Alkaline Phosphatase: 80 IU/L (ref 44–121)
BUN/Creatinine Ratio: 15 (ref 12–28)
BUN: 16 mg/dL (ref 8–27)
Bilirubin Total: 0.8 mg/dL (ref 0.0–1.2)
CO2: 27 mmol/L (ref 20–29)
Calcium: 10 mg/dL (ref 8.7–10.3)
Chloride: 101 mmol/L (ref 96–106)
Creatinine, Ser: 1.06 mg/dL — ABNORMAL HIGH (ref 0.57–1.00)
Globulin, Total: 2.5 g/dL (ref 1.5–4.5)
Glucose: 92 mg/dL (ref 70–99)
Potassium: 3.7 mmol/L (ref 3.5–5.2)
Sodium: 141 mmol/L (ref 134–144)
Total Protein: 6.7 g/dL (ref 6.0–8.5)
eGFR: 58 mL/min/{1.73_m2} — ABNORMAL LOW (ref >59)

## 2022-08-25 LAB — LIPID PANEL WITH LDL/HDL RATIO
Cholesterol, Total: 176 mg/dL (ref 100–199)
HDL: 56 mg/dL (ref >39)
LDL Chol Calc (NIH): 99 mg/dL (ref 0–99)
LDL/HDL Ratio: 1.8 ratio (ref 0.0–3.2)
Triglycerides: 118 mg/dL (ref 0–149)
VLDL Cholesterol Cal: 21 mg/dL (ref 5–40)

## 2022-08-25 LAB — LDL CHOLESTEROL, DIRECT: LDL Direct: 104 mg/dL — ABNORMAL HIGH (ref 0–99)

## 2022-08-26 DIAGNOSIS — M79605 Pain in left leg: Secondary | ICD-10-CM | POA: Diagnosis not present

## 2022-08-26 DIAGNOSIS — M1990 Unspecified osteoarthritis, unspecified site: Secondary | ICD-10-CM | POA: Diagnosis not present

## 2022-09-02 DIAGNOSIS — M79605 Pain in left leg: Secondary | ICD-10-CM | POA: Diagnosis not present

## 2022-09-02 DIAGNOSIS — M1990 Unspecified osteoarthritis, unspecified site: Secondary | ICD-10-CM | POA: Diagnosis not present

## 2022-09-07 ENCOUNTER — Other Ambulatory Visit: Payer: Self-pay | Admitting: Cardiology

## 2022-09-07 DIAGNOSIS — I2510 Atherosclerotic heart disease of native coronary artery without angina pectoris: Secondary | ICD-10-CM

## 2022-09-07 DIAGNOSIS — E782 Mixed hyperlipidemia: Secondary | ICD-10-CM

## 2022-09-07 DIAGNOSIS — R931 Abnormal findings on diagnostic imaging of heart and coronary circulation: Secondary | ICD-10-CM

## 2022-09-08 DIAGNOSIS — N179 Acute kidney failure, unspecified: Secondary | ICD-10-CM | POA: Diagnosis not present

## 2022-09-08 DIAGNOSIS — M1612 Unilateral primary osteoarthritis, left hip: Secondary | ICD-10-CM | POA: Diagnosis not present

## 2022-09-08 DIAGNOSIS — R931 Abnormal findings on diagnostic imaging of heart and coronary circulation: Secondary | ICD-10-CM | POA: Diagnosis not present

## 2022-09-08 DIAGNOSIS — M1712 Unilateral primary osteoarthritis, left knee: Secondary | ICD-10-CM | POA: Diagnosis not present

## 2022-09-08 DIAGNOSIS — I38 Endocarditis, valve unspecified: Secondary | ICD-10-CM | POA: Diagnosis not present

## 2022-09-08 DIAGNOSIS — I5189 Other ill-defined heart diseases: Secondary | ICD-10-CM | POA: Diagnosis not present

## 2022-09-17 DIAGNOSIS — R1030 Lower abdominal pain, unspecified: Secondary | ICD-10-CM | POA: Diagnosis not present

## 2022-09-17 DIAGNOSIS — D259 Leiomyoma of uterus, unspecified: Secondary | ICD-10-CM | POA: Diagnosis not present

## 2022-09-18 ENCOUNTER — Other Ambulatory Visit (HOSPITAL_COMMUNITY): Payer: Self-pay | Admitting: Obstetrics & Gynecology

## 2022-09-18 DIAGNOSIS — R1030 Lower abdominal pain, unspecified: Secondary | ICD-10-CM

## 2022-09-27 ENCOUNTER — Ambulatory Visit (HOSPITAL_COMMUNITY): Payer: Medicare HMO

## 2022-10-02 ENCOUNTER — Ambulatory Visit (HOSPITAL_COMMUNITY)
Admission: RE | Admit: 2022-10-02 | Discharge: 2022-10-02 | Disposition: A | Discharge status: Home / Self Care | Payer: Medicare HMO | Source: Ambulatory Visit | Attending: Obstetrics & Gynecology | Admitting: Obstetrics & Gynecology

## 2022-10-02 DIAGNOSIS — K5730 Diverticulosis of large intestine without perforation or abscess without bleeding: Secondary | ICD-10-CM | POA: Diagnosis not present

## 2022-10-02 DIAGNOSIS — K7689 Other specified diseases of liver: Secondary | ICD-10-CM | POA: Diagnosis not present

## 2022-10-02 DIAGNOSIS — R1030 Lower abdominal pain, unspecified: Secondary | ICD-10-CM | POA: Insufficient documentation

## 2022-10-02 DIAGNOSIS — D259 Leiomyoma of uterus, unspecified: Secondary | ICD-10-CM | POA: Diagnosis not present

## 2022-10-02 MED ORDER — IOHEXOL 300 MG/ML  SOLN
75.0000 mL | Freq: Once | INTRAMUSCULAR | Status: AC | PRN
  Administered 2022-10-02: 100 mL via INTRAVENOUS

## 2022-10-05 ENCOUNTER — Ambulatory Visit (HOSPITAL_COMMUNITY): Admission: RE | Admit: 2022-10-05 | Payer: Medicare HMO | Source: Ambulatory Visit

## 2022-10-27 DIAGNOSIS — I10 Essential (primary) hypertension: Secondary | ICD-10-CM | POA: Diagnosis not present

## 2022-11-24 DIAGNOSIS — R931 Abnormal findings on diagnostic imaging of heart and coronary circulation: Secondary | ICD-10-CM | POA: Diagnosis not present

## 2022-11-24 DIAGNOSIS — E7800 Pure hypercholesterolemia, unspecified: Secondary | ICD-10-CM | POA: Diagnosis not present

## 2022-11-24 DIAGNOSIS — R1030 Lower abdominal pain, unspecified: Secondary | ICD-10-CM | POA: Diagnosis not present

## 2022-11-24 DIAGNOSIS — I700 Atherosclerosis of aorta: Secondary | ICD-10-CM | POA: Diagnosis not present

## 2022-11-24 DIAGNOSIS — M1712 Unilateral primary osteoarthritis, left knee: Secondary | ICD-10-CM | POA: Diagnosis not present

## 2022-11-24 DIAGNOSIS — R7303 Prediabetes: Secondary | ICD-10-CM | POA: Diagnosis not present

## 2022-11-24 DIAGNOSIS — N281 Cyst of kidney, acquired: Secondary | ICD-10-CM | POA: Diagnosis not present

## 2022-11-24 DIAGNOSIS — M1612 Unilateral primary osteoarthritis, left hip: Secondary | ICD-10-CM | POA: Diagnosis not present

## 2022-11-24 DIAGNOSIS — I10 Essential (primary) hypertension: Secondary | ICD-10-CM | POA: Diagnosis not present

## 2022-12-28 DIAGNOSIS — H9313 Tinnitus, bilateral: Secondary | ICD-10-CM | POA: Diagnosis not present

## 2022-12-28 DIAGNOSIS — H9319 Tinnitus, unspecified ear: Secondary | ICD-10-CM | POA: Diagnosis not present

## 2022-12-28 DIAGNOSIS — H903 Sensorineural hearing loss, bilateral: Secondary | ICD-10-CM | POA: Diagnosis not present

## 2023-02-21 IMAGING — CR DG CHEST 2V
2 series · 2 of 2 positions shown · non-contrast
Comparison: 06/18/2009

CLINICAL DATA: Cough

EXAM:
CHEST - 2 VIEW

[w chest pa]
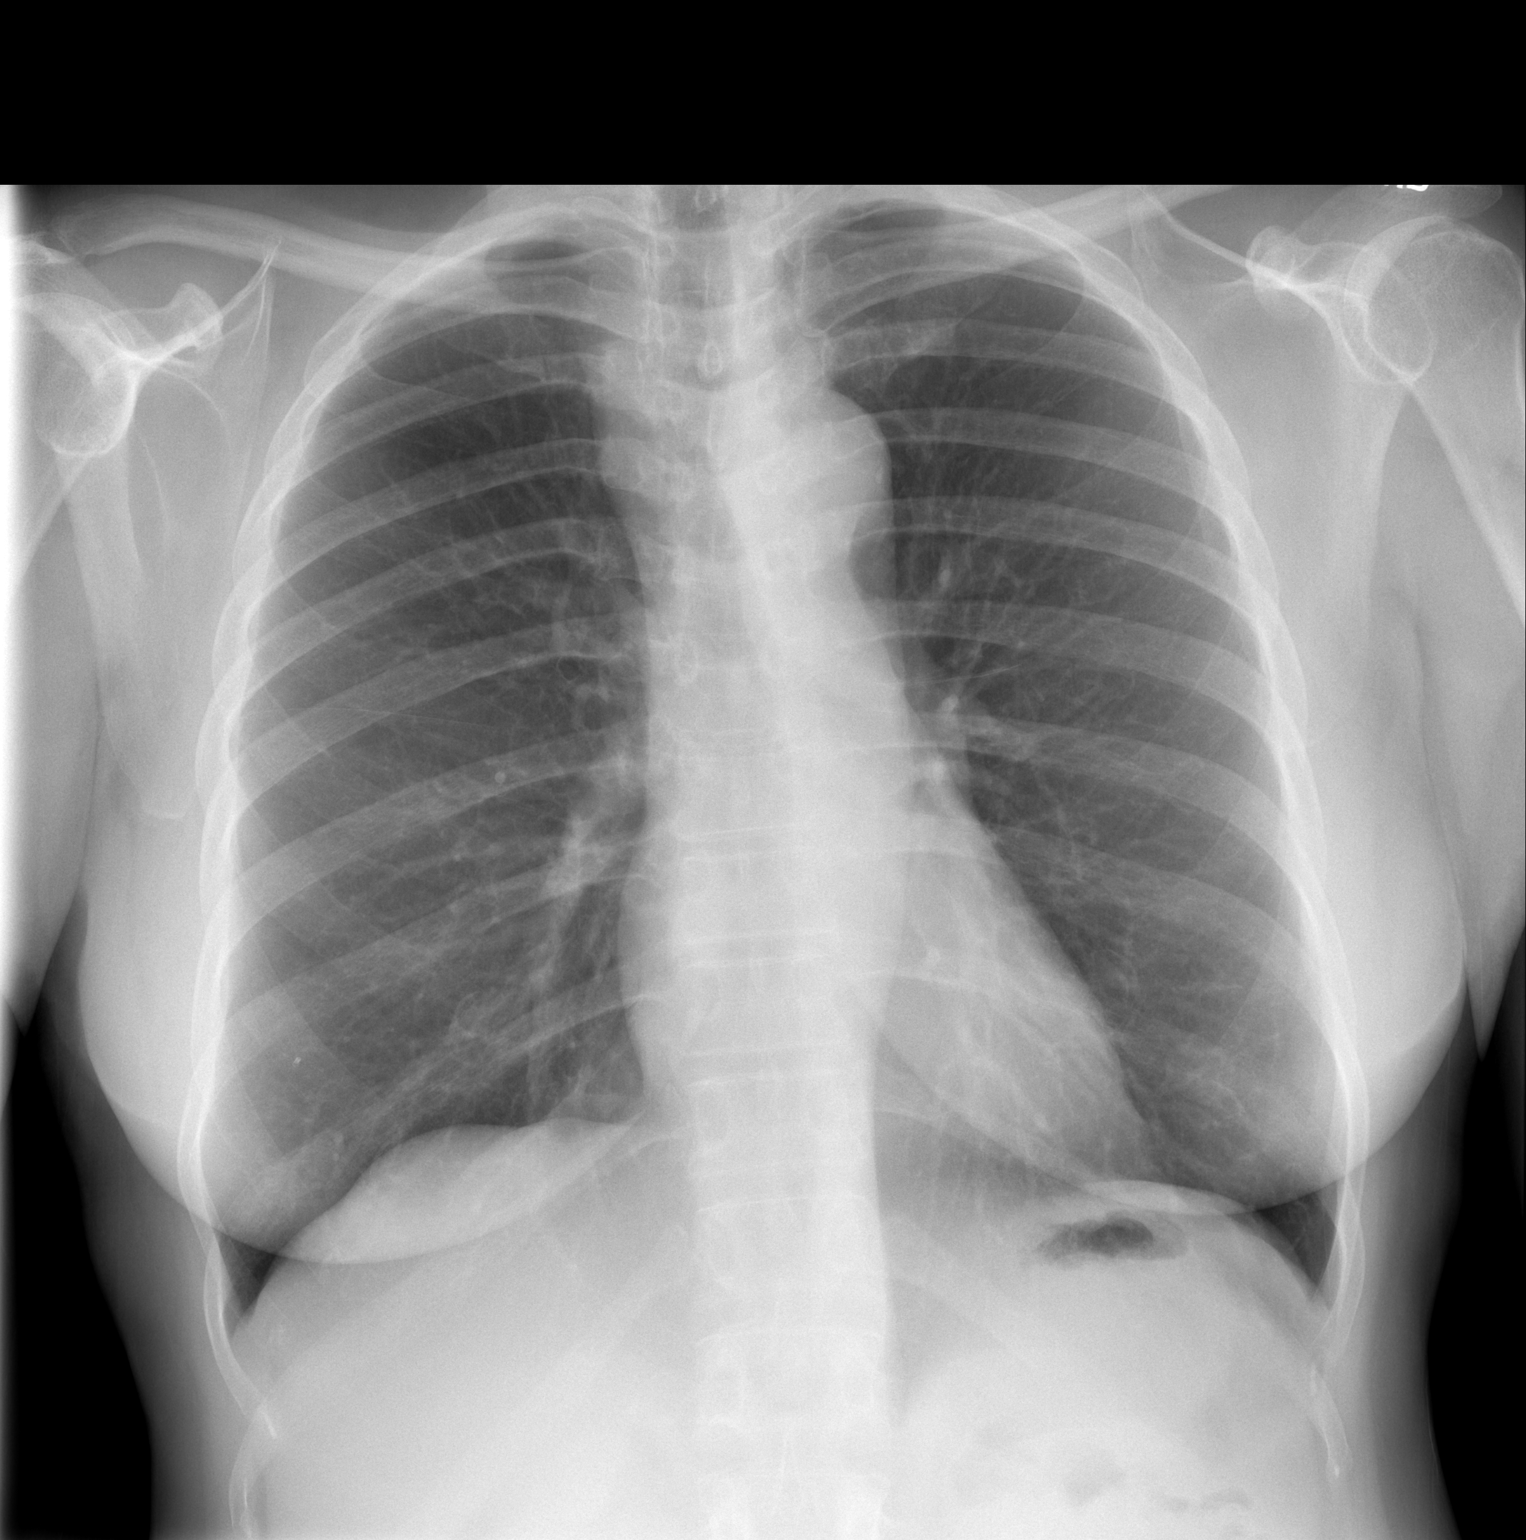

[w chest lat]
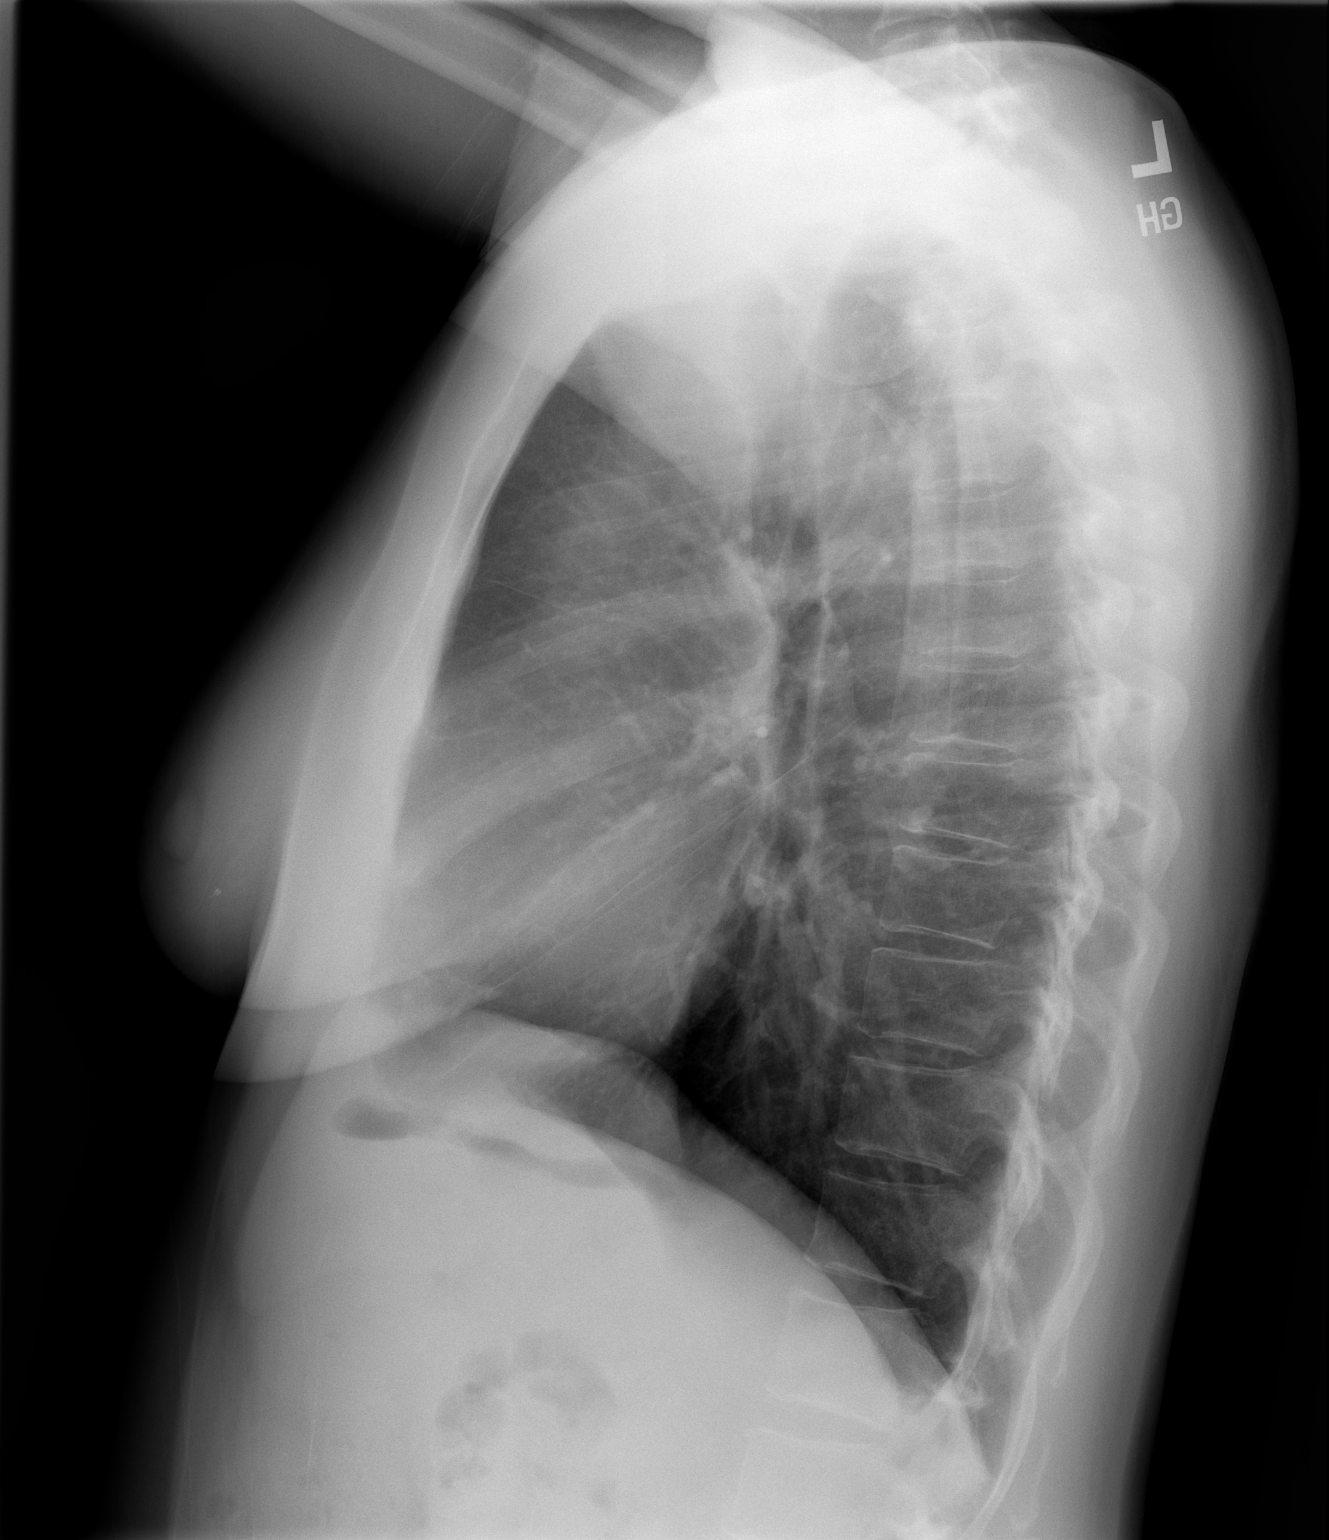

[2 of 2 positions shown; findings below may reference images not displayed]

FINDINGS: The heart size and mediastinal contours are within normal limits.
Both lungs are clear. The visualized skeletal structures are
unremarkable.
IMPRESSION: No active cardiopulmonary disease.

## 2023-04-19 DIAGNOSIS — Z1231 Encounter for screening mammogram for malignant neoplasm of breast: Secondary | ICD-10-CM | POA: Diagnosis not present

## 2023-04-30 ENCOUNTER — Other Ambulatory Visit: Payer: Self-pay | Admitting: Medical Genetics

## 2023-04-30 DIAGNOSIS — Z006 Encounter for examination for normal comparison and control in clinical research program: Secondary | ICD-10-CM

## 2023-04-30 LAB — POCT ABI - SCREENING FOR PILOT NO CHARGE
Left ABI: 1.22
Right ABI: 1.21

## 2023-04-30 LAB — LIPID PANEL
Cholesterol: 114
HDL: 44
LDL: 46
Triglycerides: 121 (ref 40–160)

## 2023-04-30 NOTE — Progress Notes (Signed)
 Pt received  ABI, BP, A1C, and Cholesterol screenings. No SDOH needs at this time.

## 2023-05-03 ENCOUNTER — Encounter (HOSPITAL_COMMUNITY): Payer: Self-pay

## 2023-05-03 ENCOUNTER — Ambulatory Visit (HOSPITAL_COMMUNITY)
Admission: EM | Admit: 2023-05-03 | Discharge: 2023-05-03 | Disposition: A | Discharge status: Home / Self Care | Payer: Medicare HMO | Attending: Internal Medicine | Admitting: Internal Medicine

## 2023-05-03 DIAGNOSIS — I10 Essential (primary) hypertension: Secondary | ICD-10-CM | POA: Insufficient documentation

## 2023-05-03 LAB — CBC
HCT: 38.6 % (ref 36.0–46.0)
Hemoglobin: 13.1 g/dL (ref 12.0–15.0)
MCH: 29.6 pg (ref 26.0–34.0)
MCHC: 33.9 g/dL (ref 30.0–36.0)
MCV: 87.1 fL (ref 80.0–100.0)
Platelets: 302 10*3/uL (ref 150–400)
RBC: 4.43 MIL/uL (ref 3.87–5.11)
RDW: 13.4 % (ref 11.5–15.5)
WBC: 4 10*3/uL (ref 4.0–10.5)
nRBC: 0 % (ref 0.0–0.2)

## 2023-05-03 LAB — BASIC METABOLIC PANEL
Anion gap: 12 (ref 5–15)
BUN: 14 mg/dL (ref 8–23)
CO2: 28 mmol/L (ref 22–32)
Calcium: 9.7 mg/dL (ref 8.9–10.3)
Chloride: 98 mmol/L (ref 98–111)
Creatinine, Ser: 0.73 mg/dL (ref 0.44–1.00)
GFR, Estimated: >60 mL/min (ref >60)
Glucose, Bld: 92 mg/dL (ref 70–99)
Potassium: 3.6 mmol/L (ref 3.5–5.1)
Sodium: 138 mmol/L (ref 135–145)

## 2023-05-03 MED ORDER — AMLODIPINE BESYLATE 5 MG PO TABS: 5.0000 mg | ORAL_TABLET | Freq: Every day | ORAL | 0 refills | Status: AC

## 2023-05-03 NOTE — ED Provider Notes (Signed)
 [facility]    CSN: [id number] Arrival date & time: 05/03/23  1242      History   Chief Complaint Chief Complaint  Patient presents with   Hypertension    HPI Sherry Webb is a 68 y.o. female.   68 year old female who presents urgent care with complaints of hypertension.  She reports that she has been on hydrochlorothiazide for about 20 years and feels it is no longer helping her blood pressure.  She has been checking her blood pressure frequently at home and has noted that the bottom number has been running above 95 and many times running over 100.  It normally does not run over 110.  She has been compliant taking her blood pressure medication and trying to maintain a low-salt diet.  She has discussed this with her primary care doctor several times and feels that they are not listening to her in regards to adjusting her medication.  She reports that that is why she did not contact them today regarding her blood pressure.  She denies any strokelike symptoms but she has concern that she could have a stroke because her blood pressure remains high.     Hypertension Pertinent negatives include no chest pain, no abdominal pain and no shortness of breath.    Past Medical History:  Diagnosis Date   Coronary artery calcification    Hyperlipidemia    Hypertension     There are no active problems to display for this patient.   Past Surgical History:  Procedure Laterality Date   BREAST BIOPSY Right     OB History   No obstetric history on file.      Home Medications    Prior to Admission medications   Medication Sig Start Date End Date Taking? Authorizing Provider  amLODipine  (NORVASC ) 5 MG tablet Take 1 tablet (5 mg total) by mouth daily. 05/03/23  Yes Kreg Pesa, PA-C  aspirin  EC 81 MG tablet Take 1 tablet (81 mg total) by mouth daily. Swallow whole. 06/12/22  Yes Tolia, Sunit, DO  hydrochlorothiazide (HYDRODIURIL) 25 MG tablet Take 25 mg by  mouth daily. 05/27/19  Yes [provider]  Multiple Vitamin (MULTIVITAMIN) tablet Take 1 tablet by mouth daily.   Yes [provider]  potassium chloride (KLOR-CON M) 10 MEQ tablet Take 10 mEq by mouth once.   Yes [provider]  atorvastatin  (LIPITOR) 20 MG tablet Take 1 tablet (20 mg total) by mouth at bedtime. 08/06/22 11/04/22  Olinda Bertrand, DO    Family History Family History  Problem Relation Age of Onset   Alcohol abuse Mother    Alcohol abuse Father     Social History Social History   Tobacco Use   Smoking status: Never   Smokeless tobacco: Never  Vaping Use   Vaping status: Never Used  Substance Use Topics   Alcohol use: Not Currently   Drug use: Never     Allergies   Lisinopril   Review of Systems Review of Systems  Constitutional:  Negative for chills and fever.  HENT:  Negative for ear pain and sore throat.   Eyes:  Negative for pain and visual disturbance.  Respiratory:  Negative for cough and shortness of breath.   Cardiovascular:  Negative for chest pain and palpitations.  Gastrointestinal:  Negative for abdominal pain and vomiting.  Genitourinary:  Negative for dysuria and hematuria.  Musculoskeletal:  Negative for arthralgias and back pain.  Skin:  Negative for color change and rash.  Neurological:  Negative for seizures and syncope.  All other systems reviewed and are negative.    Physical Exam Triage Vital Signs ED Triage Vitals [05/03/23 1454]  Encounter Vitals Group     BP (!) 158/98     Systolic BP Percentile      Diastolic BP Percentile      Pulse Rate 75     Resp 16     Temp 98.4 F (36.9 C)     Temp Source Oral     SpO2 96 %     Weight      Height      Head Circumference      Peak Flow      Pain Score      Pain Loc      Pain Education      Exclude from Growth Chart    No data found.  Updated Vital Signs BP (!) 158/98 (BP Location: Left Arm)   Pulse 75   Temp 98.4 F (36.9 C) (Oral)   Resp 16    SpO2 96%   Visual Acuity Right Eye Distance:   Left Eye Distance:   Bilateral Distance:    Right Eye Near:   Left Eye Near:    Bilateral Near:     Physical Exam Vitals and nursing note reviewed.  Constitutional:      General: She is not in acute distress.    Appearance: She is well-developed.  HENT:     Head: Normocephalic and atraumatic.  Eyes:     Conjunctiva/sclera: Conjunctivae normal.  Cardiovascular:     Rate and Rhythm: Normal rate and regular rhythm.     Heart sounds: No murmur heard.    Comments: NO carotid bruits. Pulmonary:     Effort: Pulmonary effort is normal. No respiratory distress.     Breath sounds: Normal breath sounds.  Abdominal:     Palpations: Abdomen is soft.     Tenderness: There is no abdominal tenderness.  Musculoskeletal:        General: No swelling.     Cervical back: Neck supple.     Right lower leg: No edema.     Left lower leg: No edema.  Skin:    General: Skin is warm and dry.     Capillary Refill: Capillary refill takes less than 2 seconds.  Neurological:     Mental Status: She is alert.  Psychiatric:        Mood and Affect: Mood normal.      UC Treatments / Results  Labs (all labs ordered are listed, but only abnormal results are displayed) Labs Reviewed  BASIC METABOLIC PANEL  CBC    EKG   Radiology No results found.  Procedures Procedures (including critical care time)  Medications Ordered in UC Medications - No data to display  Initial Impression / Assessment and Plan / UC Course  I have reviewed the triage vital signs and the nursing notes.  Pertinent labs & imaging results that were available during my care of the patient were reviewed by me and considered in my medical decision making (see chart for details).     Essential hypertension   Worsening hypertension on hydrochlorothiazide currently. Hasn't had lab work within the last 6 months and will order CBC and BMP today. As long as no evidence of  kidney disease, can start Norvasc  5 mg daily. Discontinue if developed lightheadedness, dizziness, or significant lower leg swelling.   Make an appointment with your primary care provider  to discuss this new medication. Keep a log of your blood pressure with readings twice daily at different times for 2 weeks and take to them for your appointment.   Blood pressure was addressed during visit and recommended patient monitor this at home. The patient should follow up with PCP for ongoing management. Discussed alarm symptoms that would warrant emergent evaluation to which patient expressed understanding.    Final Clinical Impressions(s) / UC Diagnoses   Final diagnoses:  Essential hypertension     Discharge Instructions      Worsening hypertension on hydrochlorothiazide currently. Hasn't had lab work within the last 6 months and will order CBC and BMP today. As long as no evidence of kidney disease, can start Norvasc  5 mg daily. Discontinue if developed lightheadedness, dizziness, or significant lower leg swelling.   Make an appointment with your primary care provider to discuss this new medication. Keep a log of your blood pressure with readings twice daily at different times for 2 weeks and take to them for your appointment.   Blood pressure was addressed during visit and recommended patient monitor this at home. The patient should follow up with PCP for ongoing management. Discussed alarm symptoms that would warrant emergent evaluation to which patient expressed understanding.     ED Prescriptions     Medication Sig Dispense Auth. Provider   amLODipine  (NORVASC ) 5 MG tablet Take 1 tablet (5 mg total) by mouth daily. 30 tablet Kreg Pesa, [place]      PDMP not reviewed this encounter.   Kreg Pesa, [place] 05/03/23 1537

## 2023-05-03 NOTE — ED Triage Notes (Signed)
 Patient presents to office for blood pressure check. Patient states her diastolic number has been running over 98 for the past 3 days.

## 2023-05-03 NOTE — Discharge Instructions (Addendum)
 Worsening hypertension on hydrochlorothiazide currently. Hasn't had lab work within the last 6 months and will order CBC and BMP today. As long as no evidence of kidney disease, can start Norvasc  5 mg daily. Discontinue if you develop lightheadedness, dizziness, or significant lower leg swelling.   Make an appointment with your primary care provider to discuss this new medication. Keep a log of your blood pressure with readings twice daily at different times for 2 weeks and take to them for your appointment.   Blood pressure was addressed during visit and recommended patient monitor this at home. The patient should follow up with PCP for ongoing management. Discussed alarm symptoms that would warrant emergent evaluation to which patient expressed understanding.

## 2023-05-14 LAB — GENECONNECT MOLECULAR SCREEN: Genetic Analysis Overall Interpretation: NEGATIVE

## 2023-05-21 ENCOUNTER — Other Ambulatory Visit: Payer: Self-pay

## 2023-05-21 DIAGNOSIS — E782 Mixed hyperlipidemia: Secondary | ICD-10-CM

## 2023-05-21 DIAGNOSIS — I2510 Atherosclerotic heart disease of native coronary artery without angina pectoris: Secondary | ICD-10-CM

## 2023-05-21 DIAGNOSIS — R931 Abnormal findings on diagnostic imaging of heart and coronary circulation: Secondary | ICD-10-CM

## 2023-05-21 MED ORDER — ATORVASTATIN CALCIUM 20 MG PO TABS: 20.0000 mg | ORAL_TABLET | Freq: Every day | ORAL | 0 refills | Status: DC

## 2023-05-27 ENCOUNTER — Telehealth: Payer: Self-pay | Admitting: Cardiology

## 2023-05-27 DIAGNOSIS — M1612 Unilateral primary osteoarthritis, left hip: Secondary | ICD-10-CM | POA: Diagnosis not present

## 2023-05-27 DIAGNOSIS — R7303 Prediabetes: Secondary | ICD-10-CM | POA: Diagnosis not present

## 2023-05-27 DIAGNOSIS — Z0000 Encounter for general adult medical examination without abnormal findings: Secondary | ICD-10-CM | POA: Diagnosis not present

## 2023-05-27 DIAGNOSIS — E2839 Other primary ovarian failure: Secondary | ICD-10-CM | POA: Diagnosis not present

## 2023-05-27 DIAGNOSIS — M1712 Unilateral primary osteoarthritis, left knee: Secondary | ICD-10-CM | POA: Diagnosis not present

## 2023-05-27 DIAGNOSIS — R931 Abnormal findings on diagnostic imaging of heart and coronary circulation: Secondary | ICD-10-CM | POA: Diagnosis not present

## 2023-05-27 DIAGNOSIS — N951 Menopausal and female climacteric states: Secondary | ICD-10-CM | POA: Diagnosis not present

## 2023-05-27 DIAGNOSIS — N281 Cyst of kidney, acquired: Secondary | ICD-10-CM | POA: Diagnosis not present

## 2023-05-27 DIAGNOSIS — I700 Atherosclerosis of aorta: Secondary | ICD-10-CM | POA: Diagnosis not present

## 2023-05-27 DIAGNOSIS — E7800 Pure hypercholesterolemia, unspecified: Secondary | ICD-10-CM | POA: Diagnosis not present

## 2023-05-27 DIAGNOSIS — I10 Essential (primary) hypertension: Secondary | ICD-10-CM | POA: Diagnosis not present

## 2023-05-27 NOTE — Telephone Encounter (Signed)
 Pt c/o medication issue:  1. Name of Medication: atorvastatin (LIPITOR) 20 MG tablet   2. How are you currently taking this medication (dosage and times per day)? Take 1 tablet (20 mg total) by mouth at bedtime.   3. Are you having a reaction (difficulty breathing--STAT)? No   4. What is your medication issue? [facility] called wanting to verify a prescription for atorvastatin (LIPITOR) 20 MG tablet . PCP got patient taking 40 MG while Dr. Odis Hollingshead got patient on 20 MG

## 2023-05-27 NOTE — Telephone Encounter (Signed)
 Pt c/o medication issue:  1. Name of Medication: atorvastatin (LIPITOR) 20 MG tablet   2. How are you currently taking this medication (dosage and times per day)?  Take 1 tablet (20 mg total) by mouth at bedtime.       3. Are you having a reaction (difficulty breathing--STAT)? No   4. What is your medication issue? They'd like to make office aware of another provider sending in a prescription for the same medication but 40mg  tablet so they'd like to know how to move forward. Please advise

## 2023-05-28 NOTE — Telephone Encounter (Signed)
 Spoke with [employer] and verified that the pt's PCP had increased the Atorvastatin dose to 40 mg. Verified that it was labeled as pt's PCP that was on the order for the Atorvastatin 40 mg and the pharmacist stated that was correct.

## 2023-05-28 NOTE — Telephone Encounter (Signed)
 Okay, please update our MAR.   Hawley Michel [place], DO, [facility]

## 2023-05-28 NOTE — Telephone Encounter (Signed)
 Left detailed message per DPR on file (release of info form 10/11/19 for all [facility] offices) explaining that Dr. Odis Hollingshead had stated it was okay to continue with the increased dose of Atorvastatin her PCP had changed. Medication list for patient has been updated.

## 2023-05-31 DIAGNOSIS — E2839 Other primary ovarian failure: Secondary | ICD-10-CM | POA: Diagnosis not present

## 2023-06-23 NOTE — Progress Notes (Signed)
 Pt attended 04/30/2023 screening event with BP of 147/98 & A1C was 5.8. At event pt did not indicate any SDOH needs. Pt also noted that she is not a smoker and listed Medicare as her insurance at the event.   Per chart review pt does have a PCP (Rinka Pahwani; [facility]), insurance, and is not a smoker. Pt's last appt with PCP was 05/27/2023. Pt does not indicate any SDOH needs at this time.  No additional pt f/u to be scheduled at this time per health equity protocol.

## 2023-06-30 ENCOUNTER — Other Ambulatory Visit: Payer: Self-pay | Admitting: Cardiology

## 2023-08-02 ENCOUNTER — Ambulatory Visit: Payer: Self-pay | Admitting: Cardiology

## 2023-08-25 ENCOUNTER — Ambulatory Visit: Attending: Cardiology | Admitting: Cardiology

## 2023-08-25 ENCOUNTER — Encounter: Payer: Self-pay | Admitting: Cardiology

## 2023-08-25 VITALS — BP 110/70 | HR 83 | Resp 16 | Ht 61.0 in | Wt 141.8 lb

## 2023-08-25 DIAGNOSIS — R931 Abnormal findings on diagnostic imaging of heart and coronary circulation: Secondary | ICD-10-CM

## 2023-08-25 DIAGNOSIS — E782 Mixed hyperlipidemia: Secondary | ICD-10-CM | POA: Diagnosis not present

## 2023-08-25 DIAGNOSIS — I10 Essential (primary) hypertension: Secondary | ICD-10-CM | POA: Diagnosis not present

## 2023-08-25 DIAGNOSIS — I2510 Atherosclerotic heart disease of native coronary artery without angina pectoris: Secondary | ICD-10-CM

## 2023-08-25 DIAGNOSIS — I2584 Coronary atherosclerosis due to calcified coronary lesion: Secondary | ICD-10-CM

## 2023-08-25 NOTE — Patient Instructions (Signed)
 Medication Instructions:  No medication changes were made at this visit. Continue current regimen.   *If you need a refill on your cardiac medications before your next appointment, please call your pharmacy*  Lab Work: None ordered today. If you have labs (blood work) drawn today and your tests are completely normal, you will receive your results only by: MyChart Message (if you have MyChart) OR A paper copy in the mail If you have any lab test that is abnormal or we need to change your treatment, we will call you to review the results.  Testing/Procedures: None ordered today.  Follow-Up: At [facility], you and your health needs are our priority.  As part of our continuing mission to provide you with exceptional heart care, our providers are all part of one team.  This team includes your primary Cardiologist (physician) and Advanced Practice Providers or APPs (Physician Assistants and Nurse Practitioners) who all work together to provide you with the care you need, when you need it.  Your next appointment:   1 year(s)  Provider:   Olinda Bertrand, DO

## 2023-08-25 NOTE — Progress Notes (Signed)
 Cardiology Office Note:  .   Date:  08/25/2023  ID:  Sherry Webb, DOB 08-02-1955, MRN [id number] PCP:  Elester Grim, MD  Former Cardiology Providers: NA [facility] Providers Cardiologist:  Olinda Bertrand, DO , [facility] (established care 06/12/2022) Electrophysiologist:  None  Click to update primary MD,subspecialty MD or APP then REFRESH:1}    Chief Complaint  Patient presents with   Coronary atherosclerosis due to calcified coronary lesion   Follow-up    History of Present Illness: .   Sherry Webb is a 68 y.o. African-American female whose past medical history and cardiovascular risk factors includes: Coronary artery calcification, hypertension, hyperlipidemia, prediabetes.  Patient was referred to practice given her history of coronary artery calcification.  She was started on aspirin  and her statin therapy was uptitrated to atorvastatin .  She did undergo ischemic workup to rule out reversible defects results mentioned below for further reference.  Patient presents today for 1 year follow-up visit.  Since last office visit patient denies any anginal chest pain or heart failure symptoms.  No hospitalizations or urgent care visits for cardiovascular reasons.  She has been compliant with her medical therapy.  No weight gain since last visit.  Physical endurance remains stable, exercises twice a week for 30 minutes each. Home SBP ranges between 121-130.   Review of Systems: .   Review of Systems  Cardiovascular:  Negative for chest pain, claudication, irregular heartbeat, leg swelling, near-syncope, orthopnea, palpitations, paroxysmal nocturnal dyspnea and syncope.  Respiratory:  Negative for shortness of breath.   Hematologic/Lymphatic: Negative for bleeding problem.    Studies Reviewed:   EKG: EKG Interpretation Date/Time:  Wednesday August 25 2023 10:19:19 EDT Ventricular Rate:  84 PR Interval:  168 QRS Duration:  72 QT Interval:  368 QTC  Calculation: 434 R Axis:   33  Text Interpretation: Normal sinus rhythm Consider Septal infarct , age undetermined When compared with ECG of 19-Jun-1998 21:26, No significant change was found Confirmed by Olinda Bertrand [phone]) on 08/25/2023 10:40:21 AM  Echocardiogram: 07/09/2022:  Normal LV systolic function with visual EF 60-65%. Left ventricle cavity is normal in size. Mild concentric hypertrophy of the left ventricle. Normal global wall motion. Doppler evidence of grade I (impaired) diastolic dysfunction, normal LAP. Calculated EF 62%. Trileaflet aortic valve. Mild (Grade I) aortic regurgitation. Mild aortic valve leaflet calcification. Structurally normal mitral valve. Mild (Grade I) mitral regurgitation. Structurally normal tricuspid valve. Mild tricuspid regurgitation. No evidence of pulmonary hypertension. No prior available for comparison.    Stress Testing: Regadenoson  (with Mod Bruce protocol) Nuclear stress test 06/23/2022 Myocardial perfusion is normal. Overall LV systolic function is normal without regional wall motion abnormalities. Stress LV EF: 59%. Low risk study. Nondiagnostic ECG stress. The heart rate response was consistent with Regadenoson . The blood pressure response was physiologic. No previous exam available for comparison.   Coronary calcium  score Date of service 05/28/2022 at [facility]. Left main 21. LAD 4. LCx 0. RCA 85. Total CAC 110, 50-75th percentile  RADIOLOGY: N/A  Risk Assessment/Calculations:   NA   Labs:       Latest Ref Rng & Units 05/03/2023    3:27 PM  CBC  WBC 4.0 - 10.5 K/uL 4.0   Hemoglobin 12.0 - 15.0 g/dL 95.6   Hematocrit 21.3 - 46.0 % 38.6   Platelets 150 - 400 K/uL 302        Latest Ref Rng & Units 05/03/2023    3:27 PM 08/24/2022   11:19 AM  BMP  Glucose 70 - 99 mg/dL 92  92   BUN 8 - 23 mg/dL 14  16   Creatinine 5.78 - 1.00 mg/dL 4.69  6.29   BUN/Creat Ratio 12 - 28  15   Sodium 135 - 145 mmol/L 138  141   Potassium  3.5 - 5.1 mmol/L 3.6  3.7   Chloride 98 - 111 mmol/L 98  101   CO2 22 - 32 mmol/L 28  27   Calcium  8.9 - 10.3 mg/dL 9.7  52.8       Latest Ref Rng & Units 05/03/2023    3:27 PM 08/24/2022   11:19 AM  CMP  Glucose 70 - 99 mg/dL 92  92   BUN 8 - 23 mg/dL 14  16   Creatinine 4.13 - 1.00 mg/dL 2.44  0.10   Sodium 272 - 145 mmol/L 138  141   Potassium 3.5 - 5.1 mmol/L 3.6  3.7   Chloride 98 - 111 mmol/L 98  101   CO2 22 - 32 mmol/L 28  27   Calcium  8.9 - 10.3 mg/dL 9.7  53.6   Total Protein 6.0 - 8.5 g/dL  6.7   Total Bilirubin 0.0 - 1.2 mg/dL  0.8   Alkaline Phos 44 - 121 IU/L  80   AST 0 - 40 IU/L  24   ALT 0 - 32 IU/L  24      Lab Results  Component Value Date   CHOL 114 04/30/2023   HDL 56 08/24/2022   LDLCALC 99 08/24/2022   LDLDIRECT 104 (H) 08/24/2022   TRIG 121 04/30/2023   No results for input(s): "LIPOA" in the last 8760 hours. No components found for: "NTPROBNP" No results for input(s): "PROBNP" in the last 8760 hours. No results for input(s): "TSH" in the last 8760 hours.     External Labs: Collected: May 27, 2023 [facility] database. Total cholesterol 149, HDL 49, LDL 83, triglycerides 92. A1c 6.1. Potassium 4.1.  Physical Exam:  Today's Vitals   08/25/23 1021  BP: 110/70  Pulse: 83  Resp: 16  SpO2: 98%  Weight: 141 lb 12.8 oz (64.3 kg)  Height: 5\' 1"  (1.549 m)   Body mass index is 26.79 kg/m. Wt Readings from Last 3 Encounters:  08/25/23 141 lb 12.8 oz (64.3 kg)  08/06/22 142 lb 9.6 oz (64.7 kg)  06/12/22 140 lb (63.5 kg)    Physical Exam  Constitutional: No distress.  hemodynamically stable  Neck: No JVD present.  Cardiovascular: Normal rate, regular rhythm, S1 normal and S2 normal. Exam reveals no gallop, no S3 and no S4.  No murmur heard. Pulmonary/Chest: Effort normal and breath sounds normal. No stridor. She has no wheezes. She has no rales.  Musculoskeletal:        General: No edema.     Cervical back: Neck supple.  Skin: Skin is warm.     Impression & Recommendation(s):  Impression:   ICD-10-CM   1. Coronary atherosclerosis due to calcified coronary lesion  I25.10 EKG 12-Lead   I25.84     2. Agatston coronary artery calcium  score between 100 and 400  R93.1     3. Mixed hyperlipidemia  E78.2     4. Benign hypertension  I10        Recommendation(s):  Coronary atherosclerosis due to calcified coronary lesion Agatston coronary artery calcium  score between 100 and 400 Total coronary calcium  score over 110. No anginal chest pain or heart failure symptoms. EKG is nonischemic.  Remains on antiplatelets and statin therapy. Reemphasized importance of improving her modifiable cardiovascular risk factors. No addition of cardiovascular testing is warranted at this time. Outside labs from 05/27/2023 independently reviewed and mentioned above Recommended increasing physical activity to 30 minutes a day 5 days a week for total of 150 minutes/week.  Mixed hyperlipidemia Atorvastatin  increased from 20 mg to 40 mg p.o. daily since last office visit. Most recent lipids reviewed from outside database as mentioned above. She denies myalgia or other side effects. Cardiology is following peripherally.   Benign hypertension Office blood pressures are well-controlled. Continue amlodipine  5 mg p.o. daily. Continue hydrochlorothiazide 25 mg p.o. daily Home blood pressures are very well-controlled  Discussed management of at least 2 chronic comorbid conditions, EKG ordered and independently reviewed outside labs from [facility] database from March 2025 independently reviewed and mentioned above, prescription drug management discussed, cardiovascular risk reduction/secondary prevention discussed,I reviewed external progress notes from Care Everywhere.  Orders Placed:  Orders Placed This Encounter  Procedures   EKG 12-Lead     Final Medication List:   No orders of the defined types were placed in this encounter.   There are no  discontinued medications.   Current Outpatient Medications:    amLODipine  (NORVASC ) 5 MG tablet, Take 1 tablet (5 mg total) by mouth daily., Disp: 30 tablet, Rfl: 0   aspirin  EC 81 MG tablet, Take 1 tablet (81 mg total) by mouth daily. Swallow whole., Disp: 30 tablet, Rfl: 12   atorvastatin  (LIPITOR) 20 MG tablet, Take 1 tablet (20 mg total) by mouth at bedtime. (Patient taking differently: Take 40 mg by mouth at bedtime.), Disp: 90 tablet, Rfl: 0   atorvastatin  (LIPITOR) 40 MG tablet, Take 40 mg by mouth at bedtime., Disp: , Rfl:    hydrochlorothiazide (HYDRODIURIL) 25 MG tablet, Take 25 mg by mouth daily., Disp: , Rfl:    Multiple Vitamin (MULTIVITAMIN) tablet, Take 1 tablet by mouth daily., Disp: , Rfl:    potassium chloride (KLOR-CON M) 10 MEQ tablet, Take 10 mEq by mouth once., Disp: , Rfl:   Consent:   NA  Disposition:   1 year follow-up sooner if needed  Her questions and concerns were addressed to her satisfaction. She voices understanding of the recommendations provided during this encounter.    Signed, Awilda Bogus, [facility]  A Division of [facility] [address], [place], [place] [address]  [place], [place] [address] 08/25/2023 10:55 AM

## 2023-11-03 DIAGNOSIS — H524 Presbyopia: Secondary | ICD-10-CM | POA: Diagnosis not present

## 2023-12-01 ENCOUNTER — Other Ambulatory Visit: Payer: Self-pay | Admitting: Internal Medicine

## 2023-12-01 ENCOUNTER — Ambulatory Visit
Admission: RE | Admit: 2023-12-01 | Discharge: 2023-12-01 | Disposition: A | Discharge status: Home / Self Care | Source: Ambulatory Visit | Attending: Internal Medicine

## 2023-12-01 DIAGNOSIS — I10 Essential (primary) hypertension: Secondary | ICD-10-CM | POA: Diagnosis not present

## 2023-12-01 DIAGNOSIS — M25571 Pain in right ankle and joints of right foot: Secondary | ICD-10-CM

## 2023-12-01 DIAGNOSIS — R7303 Prediabetes: Secondary | ICD-10-CM | POA: Diagnosis not present

## 2023-12-01 DIAGNOSIS — J029 Acute pharyngitis, unspecified: Secondary | ICD-10-CM | POA: Diagnosis not present

## 2023-12-01 DIAGNOSIS — Z03818 Encounter for observation for suspected exposure to other biological agents ruled out: Secondary | ICD-10-CM | POA: Diagnosis not present

## 2023-12-01 DIAGNOSIS — M7989 Other specified soft tissue disorders: Secondary | ICD-10-CM | POA: Diagnosis not present

## 2023-12-01 DIAGNOSIS — E7800 Pure hypercholesterolemia, unspecified: Secondary | ICD-10-CM | POA: Diagnosis not present

## 2023-12-01 DIAGNOSIS — R931 Abnormal findings on diagnostic imaging of heart and coronary circulation: Secondary | ICD-10-CM | POA: Diagnosis not present

## 2023-12-01 DIAGNOSIS — I700 Atherosclerosis of aorta: Secondary | ICD-10-CM | POA: Diagnosis not present

## 2023-12-08 DIAGNOSIS — Z0100 Encounter for examination of eyes and vision without abnormal findings: Secondary | ICD-10-CM | POA: Diagnosis not present

## 2024-01-17 DIAGNOSIS — H903 Sensorineural hearing loss, bilateral: Secondary | ICD-10-CM | POA: Diagnosis not present
